# Patient Record
Sex: Female | Born: 1960
Health system: Southern US, Community
[De-identification: ages and names within clinical notes are randomized; demographics above are authoritative.]

## PROBLEM LIST (undated history)

## (undated) DIAGNOSIS — K219 Gastro-esophageal reflux disease without esophagitis: Secondary | ICD-10-CM

## (undated) DIAGNOSIS — D169 Benign neoplasm of bone and articular cartilage, unspecified: Secondary | ICD-10-CM

## (undated) DIAGNOSIS — E785 Hyperlipidemia, unspecified: Secondary | ICD-10-CM

## (undated) DIAGNOSIS — J45909 Unspecified asthma, uncomplicated: Secondary | ICD-10-CM

## (undated) DIAGNOSIS — K859 Acute pancreatitis without necrosis or infection, unspecified: Secondary | ICD-10-CM

## (undated) DIAGNOSIS — IMO0001 Reserved for inherently not codable concepts without codable children: Secondary | ICD-10-CM

## (undated) DIAGNOSIS — R030 Elevated blood-pressure reading, without diagnosis of hypertension: Secondary | ICD-10-CM

## (undated) DIAGNOSIS — E119 Type 2 diabetes mellitus without complications: Principal | ICD-10-CM

## (undated) HISTORY — DX: Reserved for inherently not codable concepts without codable children: IMO0001

## (undated) HISTORY — DX: Benign neoplasm of bone and articular cartilage, unspecified: D16.9

## (undated) HISTORY — DX: Type 2 diabetes mellitus without complications: E11.9

## (undated) HISTORY — PX: OTHER SURGICAL HISTORY: SHX169

## (undated) HISTORY — DX: Hyperlipidemia, unspecified: E78.5

## (undated) HISTORY — DX: Acute pancreatitis without necrosis or infection, unspecified: K85.90

## (undated) HISTORY — DX: Unspecified asthma, uncomplicated: J45.909

## (undated) HISTORY — DX: Gastro-esophageal reflux disease without esophagitis: K21.9

## (undated) HISTORY — DX: Elevated blood-pressure reading, without diagnosis of hypertension: R03.0

---

## 1999-12-22 ENCOUNTER — Other Ambulatory Visit: Admission: RE | Admit: 1999-12-22 | Discharge: 1999-12-22 | Payer: Self-pay | Admitting: Unknown Physician Specialty

## 1999-12-30 ENCOUNTER — Encounter: Admission: RE | Admit: 1999-12-30 | Discharge: 1999-12-30 | Payer: Self-pay | Admitting: Obstetrics and Gynecology

## 1999-12-30 ENCOUNTER — Encounter: Payer: Self-pay | Admitting: *Deleted

## 2000-12-24 ENCOUNTER — Other Ambulatory Visit: Admission: RE | Admit: 2000-12-24 | Discharge: 2000-12-24 | Payer: Self-pay | Admitting: *Deleted

## 2001-01-17 ENCOUNTER — Encounter: Admission: RE | Admit: 2001-01-17 | Discharge: 2001-01-17 | Payer: Self-pay | Admitting: *Deleted

## 2001-01-17 ENCOUNTER — Encounter: Payer: Self-pay | Admitting: *Deleted

## 2002-01-02 ENCOUNTER — Other Ambulatory Visit: Admission: RE | Admit: 2002-01-02 | Discharge: 2002-01-02 | Payer: Self-pay | Admitting: *Deleted

## 2002-04-13 ENCOUNTER — Encounter: Payer: Self-pay | Admitting: *Deleted

## 2002-04-13 ENCOUNTER — Encounter: Admission: RE | Admit: 2002-04-13 | Discharge: 2002-04-13 | Payer: Self-pay | Admitting: *Deleted

## 2002-11-16 ENCOUNTER — Encounter: Payer: Self-pay | Admitting: Gastroenterology

## 2002-11-16 ENCOUNTER — Encounter: Admission: RE | Admit: 2002-11-16 | Discharge: 2002-11-16 | Payer: Self-pay | Admitting: Gastroenterology

## 2002-11-29 ENCOUNTER — Encounter (INDEPENDENT_AMBULATORY_CARE_PROVIDER_SITE_OTHER): Payer: Self-pay | Admitting: Specialist

## 2002-11-29 ENCOUNTER — Ambulatory Visit (HOSPITAL_COMMUNITY): Admission: RE | Admit: 2002-11-29 | Discharge: 2002-11-29 | Payer: Self-pay | Admitting: Gastroenterology

## 2003-01-03 ENCOUNTER — Ambulatory Visit (HOSPITAL_COMMUNITY): Admission: RE | Admit: 2003-01-03 | Discharge: 2003-01-03 | Payer: Self-pay | Admitting: *Deleted

## 2003-01-03 ENCOUNTER — Encounter (INDEPENDENT_AMBULATORY_CARE_PROVIDER_SITE_OTHER): Payer: Self-pay

## 2003-02-02 ENCOUNTER — Other Ambulatory Visit: Admission: RE | Admit: 2003-02-02 | Discharge: 2003-02-02 | Payer: Self-pay | Admitting: *Deleted

## 2003-06-06 ENCOUNTER — Encounter: Admission: RE | Admit: 2003-06-06 | Discharge: 2003-06-06 | Payer: Self-pay | Admitting: *Deleted

## 2004-07-24 ENCOUNTER — Encounter: Admission: RE | Admit: 2004-07-24 | Discharge: 2004-07-24 | Payer: Self-pay | Admitting: *Deleted

## 2005-07-28 ENCOUNTER — Encounter: Admission: RE | Admit: 2005-07-28 | Discharge: 2005-07-28 | Payer: Self-pay | Admitting: *Deleted

## 2006-09-06 ENCOUNTER — Encounter: Admission: RE | Admit: 2006-09-06 | Discharge: 2006-09-06 | Payer: Self-pay | Admitting: Specialist

## 2007-05-26 HISTORY — PX: PARTIAL HYSTERECTOMY: SHX80

## 2007-05-26 LAB — HM COLONOSCOPY

## 2007-08-10 ENCOUNTER — Encounter: Admission: RE | Admit: 2007-08-10 | Discharge: 2007-08-10 | Payer: Self-pay | Admitting: Gastroenterology

## 2007-10-27 ENCOUNTER — Encounter: Admission: RE | Admit: 2007-10-27 | Discharge: 2007-10-27 | Payer: Self-pay | Admitting: Specialist

## 2008-10-29 ENCOUNTER — Encounter: Admission: RE | Admit: 2008-10-29 | Discharge: 2008-10-29 | Payer: Self-pay | Admitting: Specialist

## 2010-03-11 ENCOUNTER — Encounter: Admission: RE | Admit: 2010-03-11 | Discharge: 2010-03-11 | Payer: Self-pay | Admitting: Specialist

## 2010-04-06 ENCOUNTER — Emergency Department (HOSPITAL_BASED_OUTPATIENT_CLINIC_OR_DEPARTMENT_OTHER): Admission: EM | Admit: 2010-04-06 | Discharge: 2010-04-06 | Payer: Self-pay | Admitting: Emergency Medicine

## 2010-04-06 ENCOUNTER — Ambulatory Visit: Payer: Self-pay | Admitting: Diagnostic Radiology

## 2010-10-10 NOTE — Op Note (Signed)
Janice Shaw, Janice Shaw                       ACCOUNT NO.:  [id number]   MEDICAL RECORD NO.:  [id number]                   PATIENT TYPE:  AMB   LOCATION:  ENDO                                 FACILITY:  [facility]   PHYSICIAN:  Anselmo Rod, M.D.               DATE OF BIRTH:  [birth year]   DATE OF PROCEDURE:  11/29/2002  DATE OF DISCHARGE:                                 OPERATIVE REPORT   PROCEDURE PERFORMED:  Screening colonoscopy.   ENDOSCOPIST:  Anselmo Rod, M.D.   INSTRUMENT USED:  Olympus video colonoscope.   INDICATIONS FOR PROCEDURE:  A 50 year old white female with a history of  abnormal weight loss, change in bowel habits and questionable polyp on  digital rectal examination undergoing colonoscopy to rule out masses, etc.   PREPROCEDURE PREPARATION:  Informed consent was procured from the patient.  The patient was fasted for eight hours prior to the procedure and prepped  with a bottle of magnesium citrate and a gallon of NuLytely the night prior  to the procedure.   PREPROCEDURE PHYSICAL:  VITAL SIGNS:  The patient had stable vital signs.  NECK:  Supple.  CHEST:  Clear to auscultation.  S1 and S2 regular.  ABDOMEN:  Soft with normal bowel sounds.   DESCRIPTION OF PROCEDURE:  The patient was placed in the left lateral  decubitus position and sedated with 100 mg of Demerol and 10 mg of Versed  intravenously. Once the patient was adequately sedated and maintained on low  flow oxygen and continuous cardiac monitoring, the Olympus video colonoscope  was advanced into the rectum to the cecum with slight difficulty.  The  patient had a very atonic colon.  Two small sessile polyps were biopsied  from 20 cm.  Small internal hemorrhoids were seen on retroflexion of the  rectum.  The appendicular orifice and ileocecal valve were visualized and  photographed.  The terminal ileum appeared normal.  The patient tolerated  the procedure well without complications.  There seemed  to be some extrinsic  compression of the rectum but no masses or polyps were seen.  Retroflexion  of the rectum revealed internal hemorrhoids but no other abnormalities were  noted.   IMPRESSION:  1. Two small sessile polyps biopsied from 20 cm.  2. Small internal hemorrhoids.  3. Questionable extrinsic of the rectum.    RECOMMENDATIONS:  1. Await pathology results.  2. Possible pelvic ultrasound to evaluate extrinsic compression of the     rectum.  3. Outpatient follow up in the next two weeks for further recommendations.                                               Anselmo Rod, M.D.    JNM/MEDQ  D:  11/29/2002  T:  11/30/2002  Job:  [id number]   cc:   Gerri Spore B. Earlene Plater, M.D.  [address]  [place]  [place] [address]  Fax: [phone]

## 2010-10-10 NOTE — H&P (Signed)
   NAME:  Janice Shaw, Janice Shaw                       ACCOUNT NO.:  [id number]   MEDICAL RECORD NO.:  [id number]                   PATIENT TYPE:  AMB   LOCATION:  SDC                                  [facility]ACILITY:  [facility]   PHYSICIAN:  [place] B. Earlene Plater, M.D.               DATE OF BIRTH:  Oct 18, 1960   DATE OF ADMISSION:  01/03/2003  DATE OF DISCHARGE:                                HISTORY & PHYSICAL   PREOPERATIVE DIAGNOSIS:  Endometrial mass.   INTENDED PROCEDURE:  Hysteroscopy D&C and removal of presumed endometrial  polyp.   HISTORY OF PRESENT ILLNESS:  A 49 year old white female gravida 1 para 1  presents for hysteroscopy and removal of presumed endometrial polyp.  The  patient had a CT scan of the abdomen and pelvis in July with Dr. Loreta Ave for  abdominal and epigastric pain.  Was found to have incidental thickening of  the endometrial lining.  Subsequent pelvic ultrasound in the office shows a  2.4 cm endometrial mass.  Given the obvious thickening which was focal in  nature a sonohysterogram was not performed and I recommended we proceed  directly to hysteroscopy.   PAST MEDICAL HISTORY:  None.   PAST SURGICAL HISTORY:  TAB x1 and granuloma removal.   MEDICATIONS:  Mircette.   ALLERGIES:  1. ASPIRIN.  2. PENICILLIN.  3. ERYTHROMYCIN.  4. CODEINE.   SOCIAL HISTORY:  No alcohol, tobacco, or other drugs.   FAMILY HISTORY:  Heart disease, diabetes, hypertension, and mother had non-  Hodgkin's lymphoma.   REVIEW OF SYSTEMS:  Otherwise negative.   PHYSICAL EXAMINATION:  VITAL SIGNS:  Blood pressure 130/80, temperature 99,  weight 144.  GENERAL:  Alert and oriented, no acute distress.  SKIN:  Warm and dry, no lesions.  HEART:  Regular rate and rhythm.  LUNGS:  Clear to auscultation.  ABDOMEN:  Liver and spleen normal, no hernia; no mass palpable.  PELVIC:  Normal external genitalia, vagina and cervix normal.  Uterus is  normal size, nontender, without adnexal masses or  tenderness.   LABORATORY DATA:  Ultrasound as outlined above shows a greater than 2 cm  focal endometrial mass consistent with a large polyp or submucosal fibroid.   ASSESSMENT:  Endometrial mass, probable polyp.  Recommend surgical removal.   PLAN:  Hysteroscopy D&C and removal of mass.  Operative risks discussed  including infection, bleeding, uterine perforation, damage to surrounding  organs, and fluid overload.  All questions answered; the patient wishes to  proceed.                                               Gerri Spore B. Earlene Plater, M.D.    WBD/MEDQ  D:  01/03/2003  T:  01/03/2003  Job:  [id number]

## 2010-10-10 NOTE — Op Note (Signed)
NAME:  Janice Shaw, Janice Shaw                       ACCOUNT NO.:  [id number]   MEDICAL RECORD NO.:  [id number]                   PATIENT TYPE:  AMB   LOCATION:  SDC                                  [facility]   PHYSICIAN:  [place] B. Earlene Plater, M.D.               DATE OF BIRTH:  1960/07/19   DATE OF PROCEDURE:  01/03/2003  DATE OF DISCHARGE:                                 OPERATIVE REPORT   PREOPERATIVE DIAGNOSIS:  Endometrial mass.   POSTOPERATIVE DIAGNOSIS:  Endometrial mass.   PROCEDURE:  Hysteroscopy, D and C, polyp removal.   SURGEON:  [place] B. Earlene Plater, M.D.   ANESTHESIA:  LMA general.   FINDINGS:  Thin endometrium.  Endometrial polyp and possible posterior  submucosal fibroid.   BLOOD LOSS:  50 cc.   FLUID DEFICIT:  100 cc Sorbitol.   SPECIMENS:  Endometrial polyp and endometrial curettings.   COMPLICATIONS:  None.   INDICATIONS:  Patient with a recently incidentally found endometrial mass  after evaluation for upper abdominal and epigastric pain.  CT scan had  revealed endometrial thickening.  Subsequent pelvic ultrasound in the office  showed an obvious endometrial mass.  The patient presents for hysteroscopic  diagnosis and potential treatment.   PROCEDURE:  The patient was taken to the operating room and LMA general  anesthesia was obtained.  He was placed in the sitting position and prepped  and draped in a standard fashion.  The bladder was emptied with a red rubber  catheter.  Exam under anesthesia showed a normal-sized anteverted uterus, no  adnexal masses palpable.   Speculum inserted.  Single-tooth tenaculum attached and the cervix easily  dilated to a #21.  The diagnostic hysteroscope was inserted after being  flushed with Sorbitol.  With uterine distension, an endometrial polyp was  visualized.  This was removed with the [place] forceps.  The  hysteroscope was reinserted and appeared to be a submucosal fibroid slightly  deforming the posterior  uterine cavity.  However, slight bleeding was  limiting visualization as it was difficult to keep the field clear and have  good uterine distension at the same time.  Therefore I dilated the cervix up  to accommodate the resectoscope and tried once again to visualize the area  more completely.  It did appear that there was a small submucosal fibroid  but I did not have adequate continuous visualization due to slight bleeding  to safely resect this area.  Therefore it was left as is.  There were no  other focal lesions noted, and therefore the procedure was terminated.   The patient tolerated the procedure well.  There were no complications.  She  was taken to the recovery room awake, alert, and in stable condition.  Gerri Spore B. Earlene Plater, M.D.    WBD/MEDQ  D:  01/03/2003  T:  01/03/2003  Job:  [id number]

## 2011-02-25 ENCOUNTER — Other Ambulatory Visit: Payer: Self-pay | Admitting: Specialist

## 2011-02-25 DIAGNOSIS — Z1231 Encounter for screening mammogram for malignant neoplasm of breast: Secondary | ICD-10-CM

## 2011-03-13 ENCOUNTER — Ambulatory Visit: Payer: Self-pay

## 2011-03-23 ENCOUNTER — Ambulatory Visit
Admission: RE | Admit: 2011-03-23 | Discharge: 2011-03-23 | Disposition: A | Discharge status: Home / Self Care | Payer: BC Managed Care – PPO | Source: Ambulatory Visit | Attending: Specialist | Admitting: Specialist

## 2011-03-23 DIAGNOSIS — Z1231 Encounter for screening mammogram for malignant neoplasm of breast: Secondary | ICD-10-CM

## 2011-06-25 ENCOUNTER — Ambulatory Visit (INDEPENDENT_AMBULATORY_CARE_PROVIDER_SITE_OTHER): Payer: BC Managed Care – PPO | Admitting: Emergency Medicine

## 2011-06-25 VITALS — BP 118/76 | HR 64 | Temp 98.8°F | Resp 16 | Ht 64.0 in | Wt 134.4 lb

## 2011-06-25 DIAGNOSIS — R05 Cough: Secondary | ICD-10-CM

## 2011-06-25 DIAGNOSIS — J40 Bronchitis, not specified as acute or chronic: Secondary | ICD-10-CM

## 2011-06-25 DIAGNOSIS — R059 Cough, unspecified: Secondary | ICD-10-CM

## 2011-06-25 DIAGNOSIS — R040 Epistaxis: Secondary | ICD-10-CM

## 2011-06-25 MED ORDER — HYDROCOD POLST-CHLORPHEN POLST 10-8 MG/5ML PO LQCR: 5.0000 mL | Freq: Every evening | ORAL | Status: DC | PRN

## 2011-06-25 MED ORDER — DOXYCYCLINE HYCLATE 100 MG PO TABS: 100.0000 mg | ORAL_TABLET | Freq: Two times a day (BID) | ORAL | Status: AC

## 2011-06-25 MED ORDER — BENZONATATE 200 MG PO CAPS: 200.0000 mg | ORAL_CAPSULE | Freq: Two times a day (BID) | ORAL | Status: AC | PRN

## 2011-06-25 NOTE — Progress Notes (Signed)
  Subjective:    Patient ID: Janice Shaw, female    DOB: 1961-05-23, 51 y.o.   MRN: [id number]  HPI Patient comes in complaining of flue like symptoms, congestion, cough, and nose bleeds.  She also complains of bad tasting phlegm in the back of her mouth.  She claims that her cough is worse at night, and coughs up phlegm in the morning.      Review of Systems  Constitutional: Negative.   HENT: Positive for nosebleeds.   Eyes: Negative.   Respiratory: Positive for cough and shortness of breath.   Cardiovascular: Negative for chest pain, palpitations and leg swelling.       Objective:   Physical Exam  Constitutional: She appears well-developed.  HENT:  Head: Normocephalic.  Eyes: Pupils are equal, round, and reactive to light.  Neck: No JVD present. No tracheal deviation present. No thyromegaly present.  Cardiovascular: Normal rate, regular rhythm and normal heart sounds.   Pulmonary/Chest: No respiratory distress. She has no wheezes. She has no rales. She exhibits no tenderness.  Abdominal: She exhibits no distension.  Lymphadenopathy:    She has no cervical adenopathy.          Assessment & Plan:  Recent symptoms of brochitis

## 2011-06-25 NOTE — Patient Instructions (Signed)

## 2012-03-30 ENCOUNTER — Other Ambulatory Visit: Payer: Self-pay | Admitting: Specialist

## 2012-03-30 DIAGNOSIS — Z1231 Encounter for screening mammogram for malignant neoplasm of breast: Secondary | ICD-10-CM

## 2012-04-26 ENCOUNTER — Encounter (INDEPENDENT_AMBULATORY_CARE_PROVIDER_SITE_OTHER): Payer: Self-pay | Admitting: Surgery

## 2012-04-26 ENCOUNTER — Ambulatory Visit (INDEPENDENT_AMBULATORY_CARE_PROVIDER_SITE_OTHER): Payer: BC Managed Care – PPO | Admitting: General Surgery

## 2012-04-26 ENCOUNTER — Encounter (INDEPENDENT_AMBULATORY_CARE_PROVIDER_SITE_OTHER): Payer: Self-pay | Admitting: General Surgery

## 2012-04-26 VITALS — BP 138/80 | HR 60 | Temp 97.8°F | Resp 20 | Ht 64.0 in | Wt 147.6 lb

## 2012-04-26 DIAGNOSIS — K645 Perianal venous thrombosis: Secondary | ICD-10-CM

## 2012-04-26 MED ORDER — TRAMADOL HCL 50 MG PO TABS: 50.0000 mg | ORAL_TABLET | Freq: Four times a day (QID) | ORAL | Status: DC | PRN

## 2012-04-26 MED ORDER — HYDROCORTISONE ACE-PRAMOXINE 2.5-1 % RE CREA: TOPICAL_CREAM | Freq: Four times a day (QID) | RECTAL | Status: DC

## 2012-04-26 NOTE — Progress Notes (Signed)
Patient ID: Janice Shaw, female   DOB: [birth year], 51 y.o.   MRN: [id number]  No chief complaint on file.   HPI Janice Shaw is a 51 y.o. female.   HPI  She is referred by Dr. Loreta Ave for treatment of a thrombosed external hemorrhoid. She was standing a lot last week and Friday had acute pain and swelling in the anal region. She's tried soaking in a tub but has no relief. She saw Dr. Loreta Ave this morning and a thrombosed external hemorrhoid was diagnosed. We were asked to see her because of that. She intermittently does have some constipation.  No past medical history on file.  Past Surgical History  Procedure Date  . Removal of vin     Family History  Problem Relation Age of Onset  . Cancer Mother     non Hodgkins lymphoma  . Heart disease Mother     CHF    Social History History  Substance Use Topics  . Smoking status: Former Smoker    Types: Cigarettes    Quit date: 04/23/2005  . Smokeless tobacco: Not on file  . Alcohol Use: Not on file    Allergies  Allergen Reactions  . Codeine Hives and Itching  . Aspirin     unknown reaction, childhood allergy.  . Erythromycin Rash  . Penicillins Other (See Comments)    childhood allergy    Current Outpatient Prescriptions  Medication Sig Dispense Refill  . dexlansoprazole (DEXILANT) 60 MG capsule Take 60 mg by mouth daily.      . hydrochlorothiazide (MICROZIDE) 12.5 MG capsule Take by mouth daily.      . Magnesium 500 MG CAPS Take by mouth.      . chlorpheniramine-HYDROcodone (TUSSIONEX PENNKINETIC ER) 10-8 MG/5ML LQCR Take 5 mLs by mouth at bedtime as needed.  60 mL  0  . hydrocortisone-pramoxine (ANALPRAM-HC) 2.5-1 % rectal cream Place rectally 4 (four) times daily. For irritated and painful hemorrhoids  15 g  2    Review of Systems Review of Systems  Gastrointestinal: Positive for anal bleeding and rectal pain. Negative for abdominal pain.    Blood pressure 138/80, pulse 60, temperature 97.8 F (36.6 C),  temperature source Temporal, resp. rate 20, height 5\' 4"  (1.626 m), weight 147 lb 9.6 oz (66.951 kg).  Physical Exam Physical Exam  Constitutional:       Uncomfortable appearing female.  Genitourinary:       Swollen right posterior external hemorrhoid with bluish discoloration centrally. Tender to the touch.    Data Reviewed Notes from Dr. Loreta Ave  Assessment    Thrombosed external hemorrhoid in the right posterior position. She has a fair amount of swelling from this.    Plan    Incision and drainage of thrombosed external hemorrhoid. Warm water soaks. Analpram 2.5% ointment to the area. Avoid constipation. Return visit one month.  The perianal area was sterilely prepped. The area around the hemorrhoid was anesthetized with Xylocaine with epinephrine. A cruciate incision was made over the bluish discoloration and clot was evacuated. A dry dressing was applied. She tolerated the procedure well.       Janice Shaw 04/26/2012, 1:48 PM

## 2012-04-26 NOTE — Patient Instructions (Signed)
Warm water tub soaks as much as possible. Avoid constipation. Take a stool softener twice a day. Use milk of magnesia as needed.

## 2012-04-27 ENCOUNTER — Ambulatory Visit: Payer: BC Managed Care – PPO

## 2012-04-28 ENCOUNTER — Telehealth (INDEPENDENT_AMBULATORY_CARE_PROVIDER_SITE_OTHER): Payer: Self-pay

## 2012-04-28 ENCOUNTER — Telehealth (INDEPENDENT_AMBULATORY_CARE_PROVIDER_SITE_OTHER): Payer: Self-pay | Admitting: General Surgery

## 2012-04-28 NOTE — Telephone Encounter (Signed)
Pt had thrombosed hem and was seen it urgent office earlier this week.  She did well the day after, but is still quite swollen now and she is trying to have a BM.  Recommended she continue the warm bath soaks or sitz baths.  Work with Dr. Loreta Ave to achieve a bowel regimen that will produce a soft BM every day.  She understands.

## 2012-04-28 NOTE — Telephone Encounter (Signed)
Pt called again still having a lot of pain and discomfort.  She was in the bathtub.  I told her to start taking Ibuprofen on a 4 hour schedule, drink plenty of water and take a stool softener as well as sitz baths at least six times a day. The swelling is her main problem, and if she followed these instructions she should start feeling better in a day or two.  She will call tomorrow if still no relief.

## 2012-04-28 NOTE — Telephone Encounter (Signed)
The pt called to discuss what she is going through since her visit.  She spoke to [place] this am but wanted to talk to [place] now

## 2012-05-02 ENCOUNTER — Encounter (INDEPENDENT_AMBULATORY_CARE_PROVIDER_SITE_OTHER): Payer: Self-pay

## 2012-05-02 ENCOUNTER — Telehealth (INDEPENDENT_AMBULATORY_CARE_PROVIDER_SITE_OTHER): Payer: Self-pay

## 2012-05-02 NOTE — Telephone Encounter (Signed)
The pt has a part time job that usually requires her to be on her feet all day.  She will need a note.  Please call to discuss.

## 2012-05-06 ENCOUNTER — Ambulatory Visit
Admission: RE | Admit: 2012-05-06 | Discharge: 2012-05-06 | Disposition: A | Discharge status: Home / Self Care | Payer: BC Managed Care – PPO | Source: Ambulatory Visit | Attending: Specialist | Admitting: Specialist

## 2012-05-06 DIAGNOSIS — Z1231 Encounter for screening mammogram for malignant neoplasm of breast: Secondary | ICD-10-CM

## 2012-06-01 ENCOUNTER — Ambulatory Visit (INDEPENDENT_AMBULATORY_CARE_PROVIDER_SITE_OTHER): Payer: BC Managed Care – PPO | Admitting: General Surgery

## 2012-06-01 ENCOUNTER — Encounter (INDEPENDENT_AMBULATORY_CARE_PROVIDER_SITE_OTHER): Payer: Self-pay | Admitting: General Surgery

## 2012-06-01 VITALS — BP 122/74 | HR 76 | Temp 97.6°F | Resp 18 | Ht 64.0 in | Wt 147.0 lb

## 2012-06-01 DIAGNOSIS — K645 Perianal venous thrombosis: Secondary | ICD-10-CM

## 2012-06-01 NOTE — Progress Notes (Signed)
Procedure:  Excision of a external thrombosed hemorrhoid  Date:  04/26/12  Pathology:  na  History:  She is here for her first post-procedure visit and is doing much better.    Exam: General- Is in NAD. Anorectal-wound has healed; multiple small external hemorrhoids are present.  Assessment:  Status post excision of external thrombosed hemorrhoid-she is doing much better.  Plan:  I advised her to avoid constipation and prolonged standing. Return visit as needed.

## 2012-06-01 NOTE — Patient Instructions (Signed)
Avoid constipation. Avoid prolonged standing.

## 2012-07-09 ENCOUNTER — Other Ambulatory Visit: Payer: Self-pay

## 2012-08-10 ENCOUNTER — Ambulatory Visit: Payer: BC Managed Care – PPO | Admitting: Family Medicine

## 2013-01-05 ENCOUNTER — Other Ambulatory Visit: Payer: Self-pay | Admitting: Specialist

## 2013-01-05 DIAGNOSIS — N6321 Unspecified lump in the left breast, upper outer quadrant: Secondary | ICD-10-CM

## 2013-01-24 ENCOUNTER — Ambulatory Visit
Admission: RE | Admit: 2013-01-24 | Discharge: 2013-01-24 | Disposition: A | Discharge status: Home / Self Care | Payer: BC Managed Care – PPO | Source: Ambulatory Visit | Attending: Specialist | Admitting: Specialist

## 2013-01-24 DIAGNOSIS — N6321 Unspecified lump in the left breast, upper outer quadrant: Secondary | ICD-10-CM

## 2013-01-30 ENCOUNTER — Encounter: Payer: Self-pay | Admitting: Internal Medicine

## 2013-01-30 ENCOUNTER — Ambulatory Visit (INDEPENDENT_AMBULATORY_CARE_PROVIDER_SITE_OTHER): Payer: BC Managed Care – PPO | Admitting: Internal Medicine

## 2013-01-30 ENCOUNTER — Ambulatory Visit (HOSPITAL_BASED_OUTPATIENT_CLINIC_OR_DEPARTMENT_OTHER)
Admission: RE | Admit: 2013-01-30 | Discharge: 2013-01-30 | Disposition: A | Discharge status: Home / Self Care | Payer: BC Managed Care – PPO | Source: Ambulatory Visit | Attending: Internal Medicine | Admitting: Internal Medicine

## 2013-01-30 VITALS — BP 146/85 | HR 88 | Temp 98.8°F | Wt 154.2 lb

## 2013-01-30 DIAGNOSIS — R059 Cough, unspecified: Secondary | ICD-10-CM

## 2013-01-30 DIAGNOSIS — R911 Solitary pulmonary nodule: Secondary | ICD-10-CM | POA: Insufficient documentation

## 2013-01-30 DIAGNOSIS — J45909 Unspecified asthma, uncomplicated: Secondary | ICD-10-CM

## 2013-01-30 DIAGNOSIS — R05 Cough: Secondary | ICD-10-CM | POA: Insufficient documentation

## 2013-01-30 DIAGNOSIS — J45991 Cough variant asthma: Secondary | ICD-10-CM | POA: Insufficient documentation

## 2013-01-30 MED ORDER — DOXYCYCLINE HYCLATE 100 MG PO TABS: 100.0000 mg | ORAL_TABLET | Freq: Two times a day (BID) | ORAL | Status: DC

## 2013-01-30 MED ORDER — BUDESONIDE-FORMOTEROL FUMARATE 160-4.5 MCG/ACT IN AERO: 2.0000 | INHALATION_SPRAY | Freq: Two times a day (BID) | RESPIRATORY_TRACT | Status: DC

## 2013-01-30 MED ORDER — HYDROCOD POLST-CHLORPHEN POLST 10-8 MG/5ML PO LQCR: 5.0000 mL | Freq: Every evening | ORAL | Status: DC | PRN

## 2013-01-30 NOTE — Patient Instructions (Signed)
Get the XR at [facility], corner of [address] (10 minutes form here); they are open 24/7 [address]  [place], [place] [address] [phone] ----  For cough, take Mucinex DM twice a day as needed  If the cough is severe, take tussionex at night symbicort 2 puffs twice a day every day Albuterol as needed for cough-wheezing  Take the antibiotic as prescribed  (doxycycline) Call if no better in few days Call anytime if the symptoms are severe

## 2013-01-30 NOTE — Addendum Note (Signed)
Addended by: Wanda Plump on: 01/30/2013 06:42 PM   Modules accepted: Level of Service

## 2013-01-30 NOTE — Progress Notes (Signed)
  Subjective:    Patient ID: Janice Shaw, female    DOB: [birth year], 52 y.o.   MRN: [id number]  HPI New patient, here with her husband. Acute visit ---> cough. Sudden onset of cough about 4 weeks ago, cough could be dry or produce some mucus, could be  persistent day or night, saw her gynecologist and GI : both said  she was wheezing. Eventually at the recommendation of her GI  went to a walk-in clinic 2 weeks ago, a chest x-ray was negative, was prescribed albuterol, Tessalon Perles and Moxifloxacin. She continue with cough, Tessalon is not helping. On further questioning, she recalls there is times when she has wheezing when she has a cold/bronchitis  Past Medical History  Diagnosis Date  . GERD (gastroesophageal reflux disease)   . Elevated BP     took HCTZ   Past Surgical History  Procedure Laterality Date  . Removal of vin    . Partial hysterectomy  2009  . Thrombosed hemorroid     History   Social History  . Marital Status: Single    Spouse Name: N/A    Number of Children: 0  . 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 Smoking status: Former Smoker    Types: Cigarettes    Quit date: 04/23/2005  . Smokeless tobacco: Never Used  . Alcohol Use: Yes     Comment: socially   . Drug Use: No  . Sexual Activity: Not on file   Other Topics Concern  . Not on file   Social History Narrative  . No narrative on file   Family History  Problem Relation Age of Onset  . Cancer Mother     non Hodgkins lymphoma  . Heart disease Mother     CHF  . Colon cancer Neg Hx   . Breast cancer Neg Hx       Review of Systems Denies fever, chills, unexpected weight loss. No chest pain or hemoptysis. Symptoms did not start after a URI this time, never had runny nose or postnasal dripping. No sneezing, itchy eyes or itchy nose. History of GERD, symptoms well controlled with omeprazole.     Objective:   Physical Exam BP  146/85  Pulse 88  Temp(Src) 98.8 F (37.1 C)  Wt 154 lb 3.2 oz (69.945 kg)  BMI 26.46 kg/m2  SpO2 99% General -- alert, well-developed, NAD.   HEENT-- Not pale. TMs normal, throat symmetric, no redness or discharge. Face symmetric, sinuses not tender to palpation. Nose not congested.  Lungs -- normal respiratory effort, no intercostal retractions, no accessory muscle use.Mild large airway congestion mostly with cough. Increased expiratory time. Not clear, wheezing. Heart-- normal rate, regular rhythm, no murmur.  Abdomen-- Not distended, good bowel sounds,soft, non-tender. Extremities-- no pretibial edema bilaterally  Neurologic-- alert & oriented X3. Speech, gait normal. Psych-- Cognition and judgment appear intact. Alert and cooperative with normal attention span and concentration. not anxious appearing and not depressed appearing.     Assessment & Plan:

## 2013-01-30 NOTE — Assessment & Plan Note (Addendum)
52 year old lady former smoker w/ 4  weeks history of cough and wheezing, in the past she did have wheezing w/ URIs. I believe she has reactive airway disease. Plan: Repeat chest x-ray Doxycycline Symbicort twice a day and albuterol when necessary Mucinex as needed Continue Prilosec, GERD symptoms well controlled See instructions reassess on RTC

## 2013-01-31 ENCOUNTER — Other Ambulatory Visit: Payer: Self-pay | Admitting: Internal Medicine

## 2013-01-31 DIAGNOSIS — R911 Solitary pulmonary nodule: Secondary | ICD-10-CM

## 2013-02-01 ENCOUNTER — Ambulatory Visit (HOSPITAL_BASED_OUTPATIENT_CLINIC_OR_DEPARTMENT_OTHER)
Admission: RE | Admit: 2013-02-01 | Discharge: 2013-02-01 | Disposition: A | Discharge status: Home / Self Care | Payer: BC Managed Care – PPO | Source: Ambulatory Visit | Attending: Internal Medicine | Admitting: Internal Medicine

## 2013-02-01 DIAGNOSIS — R911 Solitary pulmonary nodule: Secondary | ICD-10-CM | POA: Insufficient documentation

## 2013-02-14 ENCOUNTER — Encounter: Payer: Self-pay | Admitting: Internal Medicine

## 2013-02-14 ENCOUNTER — Ambulatory Visit (INDEPENDENT_AMBULATORY_CARE_PROVIDER_SITE_OTHER): Payer: BC Managed Care – PPO | Admitting: Internal Medicine

## 2013-02-14 VITALS — BP 137/76 | HR 76 | Temp 98.4°F | Wt 153.0 lb

## 2013-02-14 DIAGNOSIS — R030 Elevated blood-pressure reading, without diagnosis of hypertension: Secondary | ICD-10-CM

## 2013-02-14 DIAGNOSIS — K859 Acute pancreatitis without necrosis or infection, unspecified: Secondary | ICD-10-CM | POA: Insufficient documentation

## 2013-02-14 DIAGNOSIS — IMO0001 Reserved for inherently not codable concepts without codable children: Secondary | ICD-10-CM

## 2013-02-14 DIAGNOSIS — Z23 Encounter for immunization: Secondary | ICD-10-CM

## 2013-02-14 DIAGNOSIS — E119 Type 2 diabetes mellitus without complications: Secondary | ICD-10-CM

## 2013-02-14 DIAGNOSIS — E785 Hyperlipidemia, unspecified: Secondary | ICD-10-CM

## 2013-02-14 DIAGNOSIS — J45909 Unspecified asthma, uncomplicated: Secondary | ICD-10-CM

## 2013-02-14 HISTORY — DX: Hyperlipidemia, unspecified: E78.5

## 2013-02-14 HISTORY — DX: Acute pancreatitis without necrosis or infection, unspecified: K85.90

## 2013-02-14 HISTORY — DX: Type 2 diabetes mellitus without complications: E11.9

## 2013-02-14 NOTE — Progress Notes (Signed)
  Subjective:    Patient ID: Janice Shaw, female    DOB: 08-09-1960, 52 y.o.   MRN: [id number]  HPI Here to discuss several issues: Reactive airway disease--continue with cough, intense at times, occasional wheezing, still produces yellow mucus apparently worse with Mucinex. Essentially not better. Pancreatitis--history of pancreatitis, the patient gave me a detailed description of her symptoms during the last year, see assessment and plan. She brought labs for my review-- she has hyperlipidemia and had A1c of 6.7. See assessment and plan. History of elevated BP--ambulatory BPs usually okay. See assessment and plan.  Past Medical History  Diagnosis Date  . GERD (gastroesophageal reflux disease)   . Elevated BP     took HCTZ  . Diabetes 02/14/2013  . Pancreatitis 02/14/2013    Due to HCTZ ? Saw Dr Loreta Ave    Past Surgical History  Procedure Laterality Date  . Removal of vin    . Partial hysterectomy  2009  . Thrombosed hemorroid     History   Social History  . Marital Status: Married    Spouse Name: N/A    Number of Children: 0  . 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 Smoking status: Former Smoker    Types: Cigarettes    Quit date: 04/23/2005  . Smokeless tobacco: Never Used  . Alcohol Use: Yes     Comment: socially   . Drug Use: No  . Sexual Activity: Not on file   Other Topics Concern  . Not on file   Social History Narrative  . No narrative on file     Review of Systems No nausea, vomiting, diarrhea or blood in the stools. No dyspnea on exertion except for the last week when she felt slightly short of breath exercising. Diet is in general healthy Exercise, she remains active trying to do zumba and  other exercises.     Objective:   Physical Exam BP 137/76  Pulse 76  Temp(Src) 98.4 F (36.9 C)  Wt 153 lb (69.4 kg)  BMI 26.25 kg/m2  SpO2 99% General -- alert, well-developed, NAD.  Lungs --  normal respiratory effort, no intercostal retractions, no accessory muscle use, few ronchi, no wheezing Heart-- normal rate, regular rhythm, no murmur.   Extremities-- no pretibial edema bilaterally   Psych-- Cognition and judgment appear intact. Cooperative with normal attention span and concentration. No anxious appearing , no depressed appearing.       Assessment & Plan:  Today , I spent more than 25 min with the patient, >50% of the time counseling About diet and exercise and understanding that her recent GI history.

## 2013-02-14 NOTE — Assessment & Plan Note (Signed)
Labs from 01/05/2013: Total cholesterol 222, LDL 131, HDL 79. Patient has mild dyslipidemia, ideal LDL around 100 given mild diabetes. Plan: Diet, exercise, recheck on return to the office.

## 2013-02-14 NOTE — Assessment & Plan Note (Signed)
Started taking HCTZ for mildly elevated BP 3 years ago, currently on no meds, ambulatory BP satisfactory. Plan: Continue monitoring BPs, reassess and return to the office

## 2013-02-14 NOTE — Patient Instructions (Signed)
Check the  blood pressure 2 or 3 times a week, be sure it is between 110/60 and 140/85. If it is consistently higher or lower, let me know  

## 2013-02-14 NOTE — Assessment & Plan Note (Signed)
Labs on  01/05/2013 done elsewhere showed an  A1c of 6.7, she has early diabetes. DX was discussed with the patient, at this point I think we need to improve even more her lifestyle. Encourage exercise more Referred to nutritionist Mrs. Spagnola.

## 2013-02-14 NOTE — Assessment & Plan Note (Addendum)
Around 6 to 12 months ago she started to have abdominal pain. Went to Dr. Loreta Ave, CT of the abdomen show an abnormal pancreas, also her lipase was in the 400s. Subsequently the patient had MRI 10/28/2012, report is scanned, no true hepatic lesion seen, mild dilatation of pancreatic duct which could be developmental. The patient was prescribed Creone, symptoms resolve, lipase went back to normal. Patient was told findings could be due to HCTZ which she started to take 3 years ago. She discontinue HCTZ 12-2012 Plan: Recheck an amylase and lipase on return to the office.

## 2013-02-14 NOTE — Assessment & Plan Note (Signed)
Since the last time she was here, she took medications as prescribed, she's not doing better. Chest x-ray clear except for a question of a nodule, followup CT did not confirm the nodule. Plan: Not improving, refer to pulmonary.

## 2013-02-15 ENCOUNTER — Encounter: Payer: Self-pay | Admitting: Internal Medicine

## 2013-02-27 ENCOUNTER — Telehealth: Payer: Self-pay | Admitting: *Deleted

## 2013-02-27 ENCOUNTER — Other Ambulatory Visit: Payer: Self-pay | Admitting: Internal Medicine

## 2013-02-27 MED ORDER — HYDROCOD POLST-CHLORPHEN POLST 10-8 MG/5ML PO LQCR: 5.0000 mL | Freq: Every evening | ORAL | Status: DC | PRN

## 2013-02-27 NOTE — Telephone Encounter (Signed)
Rx printed

## 2013-02-27 NOTE — Telephone Encounter (Signed)
Pt called and stated that she need a refill on her Tussionex. Pt has an appointment on next Wed with the pulmonologist. She would like the medication be refill until her appointment. Please advise.  SW, CMA

## 2013-03-06 ENCOUNTER — Telehealth: Payer: Self-pay | Admitting: *Deleted

## 2013-03-06 ENCOUNTER — Encounter: Payer: Self-pay | Admitting: *Deleted

## 2013-03-06 MED ORDER — HYDROCOD POLST-CHLORPHEN POLST 10-8 MG/5ML PO LQCR: 5.0000 mL | Freq: Every evening | ORAL | Status: DC | PRN

## 2013-03-06 NOTE — Telephone Encounter (Signed)
Advise patient, I will refill her medication. Needs a contract pain he is at her earliest convenience. Also,  she was referred to pulmonary, please be assured that happens.

## 2013-03-06 NOTE — Telephone Encounter (Signed)
chlorpheniramine-HYDROcodone (TUSSIONEX PENNKINETIC ER) 10-8 MG/5ML LQCR Last OV: 02/14/2013 Last refill: 02/27/2013 No UDS on file. Patient needs to sign controlled substance contract

## 2013-03-06 NOTE — Telephone Encounter (Signed)
Called patient to advise her that prescription for Tussionex was ready for pick up at our front desk. She stated that she didn't need the prescription because it was picked up last week at her pharmacy.

## 2013-03-08 ENCOUNTER — Ambulatory Visit (INDEPENDENT_AMBULATORY_CARE_PROVIDER_SITE_OTHER): Payer: BC Managed Care – PPO | Admitting: Internal Medicine

## 2013-03-08 ENCOUNTER — Encounter: Payer: Self-pay | Admitting: Internal Medicine

## 2013-03-08 VITALS — BP 140/88 | HR 73 | Temp 97.9°F | Ht 64.0 in | Wt 152.4 lb

## 2013-03-08 DIAGNOSIS — R05 Cough: Secondary | ICD-10-CM

## 2013-03-08 DIAGNOSIS — R059 Cough, unspecified: Secondary | ICD-10-CM

## 2013-03-08 DIAGNOSIS — R053 Chronic cough: Secondary | ICD-10-CM

## 2013-03-08 NOTE — Progress Notes (Signed)
Subjective:    Patient ID: Janice Shaw, female    DOB: 1960/08/16, 52 y.o.   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 Previously well. Works out actively in the [employer]. Was well up until end of July. She made a routine social trip to [place] to visit her family and upon return noticed insidious onset of cough associated with wheeze and some yellow sputum. It was mild initially. She noted. Sometime in September she had OB visit and Dr. noticed some wheezing. Several weeks later routine clinical exam by gastroenterologist and wheezing was again noted. She then reported to an urgent care where she was treated with antibiotics but this did not help cough. Overall cough severity is unchanged since onset. She describes cough as moderate in severity with occasional severe episodes. Cough is present a day and night. Cough is associated with white and yellow mucus. Cough is improved with Tussionex but after 12 hours cough returns. Cough is also made worse with heavy is zumba class. Primary care physician tried Symbicort but she is only taking this irregularly so she's not sure if this is helpful.  - There is associated feeling of mucus stuck in the chest but no associated gag or ticklishness in the throat or postnasal drainage  RSI cough score 25 and reflects LPR cough. Cough differentiator 1.5 for GERD, 2 for neurogenic/airway  Cough relevant history  - Exposures: Previous smoker. Quit 7 years ago. 16.5 pack smoking history  - Allergies: She denies any spring allergies and nasal drainage  - GI: She has chronic acid reflux disease that is essentially controlled with proton pump inhibitor. However, if she stops them she will get symptoms within a few days.  - Pulmonary history: No known diagnosis of asthma or COPD. CT scan of the chest September 2014 is normal Spirometyr today is normal.  Her brother has asthma (last symbicort 3d ago)  - Hypertension: Not on any  medication   Dr Gretta Cool Reflux Symptom Index (> 13-15 suggestive of LPR cough) 0 -> 5  =  none ->severe problem  Hoarseness of problem with voice 3  Clearing  Of Throat 4  Excess throat mucus or feeling of post nasal drip 4  Difficulty swallowing food, liquid or tablets 0  Cough after eating or lying down 4  Breathing difficulties or choking episodes 1  Troublesome or annoying cough 5  Sensation of something sticking in throat or lump in throat 4  Heartburn, chest pain, indigestion, or stomach acid coming up 0  TOTAL 25     Kouffman Reflux v Neurogenic Cough Differentiator Reflux  Do you awaken from a sound sleep coughing violently?                            With trouble breathing? sometimes  Do you have choking episodes when you cannot  Get enough air, gasping for air ?              no  Do you usually cough when you lie down into  The bed, or when you just lie down to rest ?                          Yes  Do you usually cough after meals or eating?         n  Do you cough when (or after) you  bend over?    n  GERD SCORE  1.5  Kouffman Reflux v Neurogenic Cough Differentiator Neurogenic  Do you more-or-less cough all day long? y  Does change of temperature make you cough? n  Does laughing or chuckling cause you to cough? y  Do fumes (perfume, automobile fumes, burned  Toast, etc.,) cause you to cough ?      n  Does speaking, singing, or talking on the phone cause you to cough   ?               n  Neurogenic/Airway score 2      Social   reports that she quit smoking about 7 years ago. Her smoking use included Cigarettes. She has a 16.5 pack-year smoking history. She has never used smokeless tobacco.   Past Medical History  Diagnosis Date  . GERD (gastroesophageal reflux disease)   . Elevated BP     took HCTZ  . Diabetes 02/14/2013  . Pancreatitis 02/14/2013    Due to HCTZ ? Saw Dr Loreta Ave      Family History  Problem Relation Age of Onset  . Cancer Mother     non  Hodgkins lymphoma  . Heart disease Mother     CHF  . Colon cancer Neg Hx   . Breast cancer Neg Hx      History   Social History  . Marital Status: Married    Spouse Name: N/A    Number of Children: 0  . 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 Smoking status: Former Smoker -- 0.50 packs/day for 33 years    Types: Cigarettes    Quit date: 04/23/2005  . Smokeless tobacco: Never Used  . Alcohol Use: Yes     Comment: socially   . Drug Use: No  . Sexual Activity: Not on file   Other Topics Concern  . Not on file   Social History Narrative  . No narrative on file     Allergies  Allergen Reactions  . Codeine Hives and Itching  . Aspirin     unknown reaction, childhood allergy.  . Hctz [Hydrochlorothiazide]     ?pancreatitis  . Erythromycin Rash  . Penicillins Other (See Comments)    childhood allergy     Outpatient Prescriptions Prior to Visit  Medication Sig Dispense Refill  . albuterol (PROAIR HFA) 108 (90 BASE) MCG/ACT inhaler Inhale 2 puffs into the lungs every 6 (six) hours as needed for wheezing.      . budesonide-formoterol (SYMBICORT) 160-4.5 MCG/ACT inhaler Inhale 2 puffs into the lungs 2 (two) times daily.  1 Inhaler  3  . chlorpheniramine-HYDROcodone (TUSSIONEX PENNKINETIC ER) 10-8 MG/5ML LQCR Take 5 mLs by mouth at bedtime as needed.  115 mL  0  . Magnesium 500 MG CAPS Take by mouth.      Marland Kitchen omeprazole (PRILOSEC) 20 MG capsule Take 20 mg by mouth daily.      Marland Kitchen zolpidem (AMBIEN) 5 MG tablet Take 5 mg by mouth as needed for sleep.      . chlorpheniramine-HYDROcodone (TUSSIONEX PENNKINETIC ER) 10-8 MG/5ML LQCR Take 5 mLs by mouth at bedtime as needed.  115 mL  0   No facility-administered medications prior to visit.      Review of Systems  Constitutional: Negative for fever and unexpected weight change.  HENT: Negative for congestion, dental problem, ear pain, nosebleeds, postnasal drip, rhinorrhea,  sinus pressure, sneezing, sore throat and trouble swallowing.   Eyes: Negative for redness and itching.  Respiratory: Positive for cough and shortness of breath. Negative for chest tightness and wheezing.   Cardiovascular: Negative for palpitations and leg swelling.  Gastrointestinal: Negative for nausea and vomiting.  Genitourinary: Negative for dysuria.  Musculoskeletal: Negative for joint swelling.  Skin: Negative for rash.  Neurological: Negative for headaches.  Hematological: Does not bruise/bleed easily.  Psychiatric/Behavioral: Negative for dysphoric mood. The patient is not nervous/anxious.        Objective:   Physical Exam  Vitals reviewed. Constitutional: She is oriented to person, place, and time. She appears well-developed and well-nourished. No distress.  HENT:  Head: Normocephalic and atraumatic.  Right Ear: External ear normal.  Left Ear: External ear normal.  Mouth/Throat: Oropharynx is clear and moist. No oropharyngeal exudate.  Mild post anasal drainag +  Eyes: Conjunctivae and EOM are normal. Pupils are equal, round, and reactive to light. Right eye exhibits no discharge. Left eye exhibits no discharge. No scleral icterus.  Neck: Normal range of motion. Neck supple. No JVD present. No tracheal deviation present. No thyromegaly present.  Cardiovascular: Normal rate, regular rhythm, normal heart sounds and intact distal pulses.  Exam reveals no gallop and no friction rub.   No murmur heard. Pulmonary/Chest: Effort normal and breath sounds normal. No respiratory distress. She has no wheezes. She has no rales. She exhibits no tenderness.  Scattered wheeze +  Abdominal: Soft. Bowel sounds are normal. She exhibits no distension and no mass. There is no tenderness. There is no rebound and no guarding.  Musculoskeletal: Normal range of motion. She exhibits no edema and no tenderness.  Lymphadenopathy:    She has no cervical adenopathy.  Neurological: She is alert and  oriented to person, place, and time. She has normal reflexes. No cranial nerve deficit. She exhibits normal muscle tone. Coordination normal.  Skin: Skin is warm and dry. No rash noted. She is not diaphoretic. No erythema. No pallor.  Psychiatric: She has a normal mood and affect. Her behavior is normal. Judgment and thought content normal.   Filed Vitals:   03/08/13 1422  BP: 140/88  Pulse: 73  Temp: 97.9 F (36.6 C)  Height: 5\' 4"  (1.626 m)  Weight: 69.128 kg (152 lb 6.4 oz)  SpO2: 91%   Body mass index is 26.15 kg/(m^2).        Assessment & Plan:

## 2013-03-08 NOTE — Patient Instructions (Addendum)
Cough is unexplained but likely combination of sinus drainge, acid reflux and undiagnosed obstructive airways disease all working togethter to cause cough. The combination can result in irritable larynx syndrome  For sinus  -  take generic fluticasone inhaler 2 squirts each nostril daily  - take 2.7% nasal saline (hypertonic nasal saline) spray 2 squirts each nigh For acid reflux  - continue prilosec For airway evaluation  - have methacholine challenge (need to be off symbicort for 12 days)  - please call us as soon as test is complete  Followup  - cal after methacholine challenge test  -  6weeks

## 2013-03-09 DIAGNOSIS — R053 Chronic cough: Secondary | ICD-10-CM | POA: Insufficient documentation

## 2013-03-09 DIAGNOSIS — R05 Cough: Secondary | ICD-10-CM | POA: Insufficient documentation

## 2013-03-09 NOTE — Assessment & Plan Note (Signed)
Cough is unexplained but likely combination of sinus drainge, acid reflux and undiagnosed obstructive airways disease all working togethter to cause cough. The combination can result in irritable larynx syndrome  For sinus  -  take generic fluticasone inhaler 2 squirts each nostril daily  - take 2.7% nasal saline (hypertonic nasal saline) spray 2 squirts each nigh For acid reflux  - continue prilosec For airway evaluation  - have methacholine challenge (need to be off symbicort for 12 days)  - please call us as soon as test is complete  Followup  - cal after methacholine challenge test  -  6weeks 

## 2013-03-15 ENCOUNTER — Telehealth: Payer: Self-pay | Admitting: Internal Medicine

## 2013-03-15 NOTE — Telephone Encounter (Signed)
Chl Mychart After Visit Questionnaire    Question 03/14/2013 11:53 AM   How are you feeling after your recent visit? the same, although I have not exercised, but feel a bit short of breath after light exertion (ie climbing steps); cough seems to tighten at night without any medication/drugs, but mucous returns the next day.   Does the recommended course of treatment seem to be helping your symptoms? No   Are you experiencing any side effects from your recommended treatment? No   is there anything else you would like to ask your physician? Why I still hear the "death rattle" in my chest when I breath. It's very unsettling.   Becomes tight cough at night, then returns to productive cough the next day.  Normally active person, now with minimal exertion (even talking on the phone) she has to "breathe deeper" and OOB with climbing stairs.  Feels lazy now and does not want to attend Zumba class b/c she does not want to start the coughing and SOB again.  Pt is frustrated and concerned about her symptoms.  Wonders about COPD and chronic bronchitis.  Patient has Meth Ch Test on 10.27.14 Dr Marchelle Gearing please advise, thank you.  **patient aware MR on 11pm elink tonight and okay with call back tomorrow **pt requested copy of her 10.15.14 spirometry >> placed in mail to verified home address

## 2013-03-16 NOTE — Telephone Encounter (Signed)
If she has been off any inhaled steroid for atleatst 5-7 days see if you can have her do methacholine challenge test today/tomorrow and see Dr wert immediately after that. Please let her know he is our local cough guru. IF not, just have her come in and see Dr Sherene Sires. I am puzzled myself and without the methacholine challenge test hard pressed to explain. So, I might need the bnefit of a second opinion   Dr. Kalman Shan, M.D., [facility].C.P Pulmonary and Critical Care Medicine Staff Physician  System [facility] Pager: [phone], If no answer or between  15:00h - 7:00h: call [phone]  03/16/2013 7:20 AM

## 2013-03-16 NOTE — Telephone Encounter (Signed)
Pt is already scheduled for [facility] on Monday. She scheduled an appt to see MW in the afternoon. Nothing further needed

## 2013-03-20 ENCOUNTER — Encounter: Payer: Self-pay | Admitting: Internal Medicine

## 2013-03-20 ENCOUNTER — Ambulatory Visit (INDEPENDENT_AMBULATORY_CARE_PROVIDER_SITE_OTHER): Payer: BC Managed Care – PPO | Admitting: Internal Medicine

## 2013-03-20 ENCOUNTER — Ambulatory Visit (HOSPITAL_COMMUNITY)
Admission: RE | Admit: 2013-03-20 | Discharge: 2013-03-20 | Disposition: A | Discharge status: Home / Self Care | Payer: BC Managed Care – PPO | Source: Ambulatory Visit | Attending: Internal Medicine | Admitting: Internal Medicine

## 2013-03-20 VITALS — BP 126/80 | HR 56 | Temp 98.0°F | Ht 63.5 in | Wt 158.4 lb

## 2013-03-20 DIAGNOSIS — J45909 Unspecified asthma, uncomplicated: Secondary | ICD-10-CM

## 2013-03-20 DIAGNOSIS — R05 Cough: Secondary | ICD-10-CM

## 2013-03-20 DIAGNOSIS — R053 Chronic cough: Secondary | ICD-10-CM

## 2013-03-20 DIAGNOSIS — R059 Cough, unspecified: Secondary | ICD-10-CM | POA: Insufficient documentation

## 2013-03-20 MED ORDER — PREDNISONE (PAK) 10 MG PO TABS: ORAL_TABLET | ORAL | Status: DC

## 2013-03-20 MED ORDER — SODIUM CHLORIDE 0.9 % IN NEBU
3.0000 mL | INHALATION_SOLUTION | Freq: Once | RESPIRATORY_TRACT | Status: AC
  Administered 2013-03-20: 3 mL via RESPIRATORY_TRACT

## 2013-03-20 MED ORDER — BUDESONIDE-FORMOTEROL FUMARATE 80-4.5 MCG/ACT IN AERO: 2.0000 | INHALATION_SPRAY | Freq: Two times a day (BID) | RESPIRATORY_TRACT | Status: DC

## 2013-03-20 MED ORDER — METHACHOLINE 0.0625 MG/ML NEB SOLN
2.0000 mL | Freq: Once | RESPIRATORY_TRACT | Status: AC
  Administered 2013-03-20: 0.125 mg via RESPIRATORY_TRACT

## 2013-03-20 MED ORDER — ALBUTEROL SULFATE (5 MG/ML) 0.5% IN NEBU
2.5000 mg | INHALATION_SOLUTION | Freq: Once | RESPIRATORY_TRACT | Status: AC
  Administered 2013-03-20: 2.5 mg via RESPIRATORY_TRACT

## 2013-03-20 MED ORDER — METHACHOLINE 4 MG/ML NEB SOLN
2.0000 mL | Freq: Once | RESPIRATORY_TRACT | Status: AC
  Administered 2013-03-20: 8 mg via RESPIRATORY_TRACT

## 2013-03-20 MED ORDER — METHACHOLINE 1 MG/ML NEB SOLN
2.0000 mL | Freq: Once | RESPIRATORY_TRACT | Status: AC
  Administered 2013-03-20: 2 mg via RESPIRATORY_TRACT

## 2013-03-20 MED ORDER — METHACHOLINE 0.25 MG/ML NEB SOLN
2.0000 mL | Freq: Once | RESPIRATORY_TRACT | Status: AC
  Administered 2013-03-20: 0.5 mg via RESPIRATORY_TRACT

## 2013-03-20 MED ORDER — METHACHOLINE 16 MG/ML NEB SOLN: 2.0000 mL | Freq: Once | RESPIRATORY_TRACT | Status: DC

## 2013-03-20 NOTE — Progress Notes (Signed)
  Subjective:    Patient ID: Janice Shaw, female    DOB: [birth year] MRN: [id number]  HPI  52 yowf lots of bad coughs as child but doesn't remember missiing then smoked with tendency to bronchitis but got better p 2006 then relapsed July 2014 and present daily since   03/20/2013  Consultation/ 2nd opinion  Janice Shaw cc cough daily not waking up with it but within an hour of getting up notes it.  Also chest  Becomes tight cough at night, then returns to productive cough the next day.  Normally active person, now with minimal exertion (even talking on the phone) she has to "breathe deeper" and OOB with climbing stairs.    had mct 03/20/13  which repreoduced most of the symptoms then improved back to chronic baseline p saba rx.  Mucus is always a bit yellow. symbicort didn't really help - the only thing that does is tussionex  No obvious day to day or daytime variabilty or assoc chronic cough or cp or chest tightness, subjective wheeze overt sinus or hb symptoms. No unusual exp hx or h/o childhood pna/ asthma or knowledge of premature birth.  Sleeping ok without nocturnal  or early am exacerbation  of respiratory  c/o's or need for noct saba. Also denies any obvious fluctuation of symptoms with weather or environmental changes or other aggravating or alleviating factors except as outlined above   Current Medications, Allergies, Complete Past Medical History, Past Surgical History, Family History, and Social History were reviewed in [employer] record.           Review of Systems  Constitutional: Negative for fever, chills and unexpected weight change.  HENT: Negative for congestion, dental problem, ear pain, nosebleeds, postnasal drip, rhinorrhea, sinus pressure, sneezing, sore throat, trouble swallowing and voice change.   Eyes: Negative for visual disturbance.  Respiratory: Positive for cough and shortness of breath. Negative for choking.   Cardiovascular: Negative for  chest pain and leg swelling.  Gastrointestinal: Negative for vomiting, abdominal pain and diarrhea.  Genitourinary: Negative for difficulty urinating.  Musculoskeletal: Negative for arthralgias.  Skin: Negative for rash.  Neurological: Negative for tremors, syncope and headaches.  Hematological: Does not bruise/bleed easily.       Objective:   Physical Exam   Pleasant healthy appearing amb wf nad  Wt Readings from Last 3 Encounters:  03/20/13 158 lb 6.4 oz (71.85 kg)  03/08/13 152 lb 6.4 oz (69.128 kg)  02/14/13 153 lb (69.4 kg)     HEENT: nl dentition, turbinates, and orophanx. Nl external ear canals without cough reflex   NECK :  without JVD/Nodes/TM/ nl carotid upstrokes bilaterally   LUNGS: no acc muscle use, clear to A and P bilaterally without cough on insp or exp maneuvers   CV:  RRR  no s3 or murmur or increase in P2, no edema   ABD:  soft and nontender with nl excursion in the supine position. No bruits or organomegaly, bowel sounds nl  MS:  warm without deformities, calf tenderness, cyanosis or clubbing  SKIN: warm and dry without lesions    NEURO:  alert, approp, no deficits     CT chest 01/23/13 No findings to explain the questioned abnormality on recent chest  radiograph.      Assessment & Plan:

## 2013-03-20 NOTE — Patient Instructions (Addendum)
Symbicort 80 Take 2 puffs first thing in am and then another 2 puffs about 12 hours later.   For excess cough > mucinex dm 1200 mg every 12 hours as needed   Prednisone 10 mg take  4 each am x 2 days,   2 each am x 2 days,  1 each am x 2 days and stop   Prilosec 20 mg  X 2 Take 30-60 min before first meal of the day  X 2 weeks  GERD (REFLUX)  is an extremely common cause of respiratory symptoms, many times with no significant heartburn at all.    It can be treated with medication, but also with lifestyle changes including avoidance of late meals, excessive alcohol, smoking cessation, and avoid fatty foods, chocolate, peppermint, colas, red wine, and acidic juices such as orange juice.  NO MINT OR MENTHOL PRODUCTS SO NO COUGH DROPS  USE SUGARLESS CANDY INSTEAD (jolley ranchers or Stover's)  NO OIL BASED VITAMINS - use powdered substitutes.   Please schedule a follow up office visit in 2 weeks, sooner if needed

## 2013-03-21 ENCOUNTER — Encounter: Payer: Self-pay | Admitting: Internal Medicine

## 2013-03-21 ENCOUNTER — Encounter: Payer: BC Managed Care – PPO | Attending: Internal Medicine | Admitting: *Deleted

## 2013-03-21 ENCOUNTER — Telehealth: Payer: Self-pay | Admitting: Internal Medicine

## 2013-03-21 ENCOUNTER — Encounter: Payer: Self-pay | Admitting: *Deleted

## 2013-03-21 VITALS — Ht 64.0 in | Wt 155.1 lb

## 2013-03-21 DIAGNOSIS — E119 Type 2 diabetes mellitus without complications: Secondary | ICD-10-CM | POA: Insufficient documentation

## 2013-03-21 DIAGNOSIS — Z713 Dietary counseling and surveillance: Secondary | ICD-10-CM | POA: Insufficient documentation

## 2013-03-21 MED ORDER — PREDNISONE (PAK) 10 MG PO TABS: ORAL_TABLET | ORAL | Status: DC

## 2013-03-21 NOTE — Telephone Encounter (Signed)
Labs on 01/05/2013 done elsewhere showed an A1c of 6.7. No need for her A1c at this time, will check when she comes back by December.

## 2013-03-21 NOTE — Assessment & Plan Note (Signed)
DDX of  difficult airways managment all start with A and  include Adherence, Ace Inhibitors, Acid Reflux, Active Sinus Disease, Alpha 1 Antitripsin deficiency, Anxiety masquerading as Airways dz,  ABPA,  allergy(esp in young), Aspiration (esp in elderly), Adverse effects of DPI,  Active smokers, plus two Bs  = Bronchiectasis and Beta blocker use..and one C= CHF  Adherence is always the initial "prime suspect" and is a multilayered concern that requires a "trust but verify" approach in every patient - starting with knowing how to use medications, especially inhalers, correctly, keeping up with refills and understanding the fundamental difference between maintenance and prns vs those medications only taken for a very short course and then stopped and not refilled. The proper method of use, as well as anticipated side effects, of a metered-dose inhaler are discussed and demonstrated to the patient. Improved effectiveness after extensive coaching during this visit to a level of approximately  75% so try symbicort 80 2bid and regroup in 2 weeks  ? Acid (or non-acid) GERD > always difficult to exclude as up to 75% of pts in some series report no assoc GI/ Heartburn symptoms> rec max (24h)  acid suppression and diet restrictions/ reviewed and instructions given in writting   ? Allergies > consider RAST next ov  ? Sinus dz > consider sinus CT next ov     Each maintenance medication was reviewed in detail including most importantly the difference between maintenance and as needed and under what circumstances the prns are to be used.  Please see instructions for details which were reviewed in writing and the patient given a copy.

## 2013-03-21 NOTE — Progress Notes (Signed)
Appt start time: 0800 end time:  0930.   Assessment:  Patient was seen on  03/21/13 for individual diabetes education. Patient has been told by Dr. Drue Novel that she has T2DM with an A1c of 6.7%. Approximately one year prior her A1c was 5.7%. She is presently in denial of this diagnosis. She feels fine and isn't going to deprive herself and be miserable. She was having some significant GI distress that was determined to be related to HCTZ as well as having pancreatitis.. She is now over the GI distress. She feels that her pancreatitis which is resolved contributed to her elevated A1c. She does not "buy in" to the T2DM. She has a follow up appointment with Dr. Drue Novel in January. I would encourage that she request a current A1c since it has been three months since that last one. She does the grocery shopping. However she does not like to cook so she eats a lot of processed foods. Does not does fast food.  Current HbA1c: 6.7% 12/05/12  Preferred Learning Style:  Visual  Hands on  Learning Readiness:   Not ready  MEDICATIONS: See List..none for T2DM  DIETARY INTAKE:  Usual eating pattern includes 2 meals and 1 snacks per day. Usually skips one meal per day.  Everyday foods include lacks adequate protein and fiber intake.  Avoided foods include peas and brussel sprouts.    Usual physical activity: used to do Zumba regularly but has not done so in a few months.    Intervention:  Nutrition counseling provided.  Discussed diabetes disease process and treatment options.  Discussed physiology of diabetes and role of obesity on insulin resistance.  Encouraged moderate weight reduction to improve glucose levels.  Discussed role of medications and diet in glucose control  Provided education on macronutrients on glucose levels.  Provided education on carb counting, importance of regularly scheduled meals/snacks, and meal planning  Discussed effects of physical activity on glucose levels and long-term  glucose control.  Recommended 150 minutes of physical activity/week.  Discussed recommended target ranges and individual ranges.    Described short-term complications: hyper- and hypo-glycemia.  Discussed causes,symptoms, and treatment options.  Discussed prevention, detection, and treatment of long-term complications.  Discussed the role of prolonged elevated glucose levels on body systems.  Discussed role of stress on blood glucose levels and discussed strategies to manage psychosocial issues.  Discussed recommendations for long-term diabetes self-care.  Established checklist for medical, dental, and emotional self-care.  Teaching Method Utilized:  Visual Auditory Hands on  Handouts given during visit include: Living Well with Diabetes Carb Counting and Food Label handouts Meal Plan Card Plate method Snack Sheet   Barriers to learning/adherence to lifestyle change: does not buy into diagnosis of T2DM  Plan:  Have current A1c performed Aim for 3 Carb Choices per meal (45 grams) +/- 1 either way  Aim for 0-1 Carbs per snack if hungry  Consider reading food labels for Total Carbohydrate and Fat Grams of foods Consider  increasing your activity level by Zumba 3X weekly for 60 minutes daily as tolerated   Diabetes self-care support plan:   [facility] support group  Family  Demonstrated degree of understanding via:  Teach Back   Monitoring/Evaluation:  Dietary intake, exercise, and body weight prn.

## 2013-03-21 NOTE — Telephone Encounter (Signed)
Patient went to visit the nutritionist we referred her to today and was advised to have her A1C checked. Needs orders.

## 2013-03-22 NOTE — Telephone Encounter (Signed)
Done. DJR  

## 2013-03-22 NOTE — Telephone Encounter (Signed)
Spoke with pt. Pt states is not "convinced" that she has diabetes also that her nutritionist recommended her to have  A1c checked  2-3 months after her last one. Pt would like to have another A1c done before her apt with Dr. Drue Novel 06/05/12 to have some closure of the fact.Marland KitchenDJR

## 2013-03-22 NOTE — Addendum Note (Signed)
Addended by: Eustace Quail on: 03/22/2013 02:36 PM   Modules accepted: Orders

## 2013-03-22 NOTE — Telephone Encounter (Signed)
Janice Shaw, enter the orders for  a A1c--- DX diabetes, Let patient know we are rechecking

## 2013-03-23 ENCOUNTER — Other Ambulatory Visit (INDEPENDENT_AMBULATORY_CARE_PROVIDER_SITE_OTHER): Payer: BC Managed Care – PPO

## 2013-03-23 DIAGNOSIS — E119 Type 2 diabetes mellitus without complications: Secondary | ICD-10-CM

## 2013-03-27 ENCOUNTER — Telehealth: Payer: Self-pay | Admitting: Internal Medicine

## 2013-03-27 NOTE — Telephone Encounter (Addendum)
No, A1c reflects her sugar for the last 3 months. Arrange a visit if the patient likes to discuss further with me

## 2013-03-27 NOTE — Telephone Encounter (Signed)
Pt notified of A1c results. Pt states took a steroid on 03/21/13 then had her a1c checked 03/23/13, wants to know if that may have caused her a1c to increase.

## 2013-03-27 NOTE — Telephone Encounter (Signed)
Patient is calling back for lab results. Please advise.  °

## 2013-04-03 ENCOUNTER — Ambulatory Visit (INDEPENDENT_AMBULATORY_CARE_PROVIDER_SITE_OTHER): Payer: BC Managed Care – PPO | Admitting: Internal Medicine

## 2013-04-03 ENCOUNTER — Encounter: Payer: Self-pay | Admitting: Internal Medicine

## 2013-04-03 VITALS — BP 124/64 | HR 60 | Temp 98.0°F | Ht 63.5 in | Wt 161.0 lb

## 2013-04-03 DIAGNOSIS — J45991 Cough variant asthma: Secondary | ICD-10-CM

## 2013-04-03 MED ORDER — BUDESONIDE-FORMOTEROL FUMARATE 80-4.5 MCG/ACT IN AERO: 2.0000 | INHALATION_SPRAY | Freq: Two times a day (BID) | RESPIRATORY_TRACT | Status: DC

## 2013-04-03 NOTE — Patient Instructions (Addendum)
Stop prilosec now- if cough recurs w/in 2 weeks then you probably have some GERD driving the cough - other wise it's probably the cough that drove the reflux and you can wait until the cough erupts again to restart it twice daily before meals  Ok in 2 weeks to begin adjusting the symbicort 80 to 0-2 puff in am and 0-2 puffs 12 hours later    If you are satisfied with your treatment plan let your doctor know and he/she can either refill your medications or you can return here when your prescription runs out.     If in any way you are not 100% satisfied,  please tell us.  If 100% better, tell your friends!

## 2013-04-03 NOTE — Progress Notes (Signed)
Subjective:    Patient ID: Janice Shaw, female    DOB: 12-11-1960 MRN: [id number]    Brief patient profile:  52 yowf lots of bad coughs as child but doesn't remember missiing  School over it then smoked with tendency to bronchitis but got better p 2006 then relapsed July 2014 and present daily since until started back on symbicort with Pos Methacholine challenge 03/20/13    History of Present Illness  03/20/2013  Consultation/ 2nd opinion  Wert cc cough daily not waking up with it but within an hour of getting up notes it.  Also chest  Becomes tight cough at night, then returns to productive cough the next day.  Normally active person, now with minimal exertion (even talking on the phone) she has to "breathe deeper" and OOB with climbing stairs.  mct 03/20/13  which reproduced most of the symptoms then improved back to chronic baseline p saba rx.  Mucus is always a bit yellow. symbicort didn't really help - the only thing that does is tussionex rec symbicort 80 2bid For excess cough > mucinex dm 1200 mg every 12 hours as needed  Prednisone 10 mg take  4 each am x 2 days,   2 each am x 2 days,  1 each am x 2 days and stop  Prilosec 20 mg  X 2 Take 30-60 min before first meal of the day  X 2 weeks GERD diet   04/03/2013 f/u ov/Wert re:  Chronic cough since May 2014  Chief Complaint  Patient presents with  . Follow-up    Pt states that her cough is much improved- about 95% better. No new co's today.     No obvious day to day or daytime variabilty or assoc sob  or cp or chest tightness, subjective wheeze overt sinus or hb symptoms. No unusual exp hx or h/o childhood pna/ asthma or knowledge of premature birth.  Sleeping ok without nocturnal  or early am exacerbation  of respiratory  c/o's or need for noct saba. Also denies any obvious fluctuation of symptoms with weather or environmental changes or other aggravating or alleviating factors except as outlined above   Current  Medications, Allergies, Complete Past Medical History, Past Surgical History, Family History, and Social History were reviewed in [employer] record.  ROS  The following are not active complaints unless bolded sore throat, dysphagia, dental problems, itching, sneezing,  nasal congestion or excess/ purulent secretions, ear ache,   fever, chills, sweats, unintended wt loss, pleuritic or exertional cp, hemoptysis,  orthopnea pnd or leg swelling, presyncope, palpitations, heartburn, abdominal pain, anorexia, nausea, vomiting, diarrhea  or change in bowel or urinary habits, change in stools or urine, dysuria,hematuria,  rash, arthralgias, visual complaints, headache, numbness weakness or ataxia or problems with walking or coordination,  change in mood/affect or memory.                      Objective:   Physical Exam   Pleasant healthy appearing amb wf nad  Wt Readings from Last 3 Encounters:  04/03/13 161 lb (73.029 kg)  03/21/13 155 lb 1.6 oz (70.353 kg)  03/20/13 158 lb 6.4 oz (71.85 kg)        HEENT: nl dentition, turbinates, and orophanx. Nl external ear canals without cough reflex   NECK :  without JVD/Nodes/TM/ nl carotid upstrokes bilaterally   LUNGS: no acc muscle use, clear to A and P bilaterally without cough on insp or  exp maneuvers   CV:  RRR  no s3 or murmur or increase in P2, no edema   ABD:  soft and nontender with nl excursion in the supine position. No bruits or organomegaly, bowel sounds nl  MS:  warm without deformities, calf tenderness, cyanosis or clubbing  SKIN: warm and dry without lesions    NEURO:  alert, approp, no deficits     CT chest 01/23/13 No findings to explain the questioned abnormality on recent chest  radiograph.      Assessment & Plan:

## 2013-04-05 NOTE — Assessment & Plan Note (Signed)
-   POS MCT 03/20/13 reproduced most of her symptoms - hfa 75% p coaching 03/20/13 > try symbicort 80 2bid > improved 95% 04/03/13   All goals of chronic asthma control met including optimal function and elimination of symptoms with no need for rescue therapy.  It remains to be seen whether gerd was a primary or secondary problem so should stop gerd rx before considers backing off the symbicort rx  The proper method of use, as well as anticipated side effects, of a metered-dose inhaler are discussed and demonstrated to the patient. Improved effectiveness after extensive coaching during this visit to a level of approximately  90% now so she should do fine on hfa and could even step down eventually to qvar though harder to titrate given it takes about a week to work whereas symbicort starts working  in 5 min.  Pulmonary f/u is prn

## 2013-05-01 ENCOUNTER — Ambulatory Visit: Payer: BC Managed Care – PPO | Admitting: Internal Medicine

## 2013-06-05 ENCOUNTER — Ambulatory Visit (INDEPENDENT_AMBULATORY_CARE_PROVIDER_SITE_OTHER): Payer: BC Managed Care – PPO | Admitting: Internal Medicine

## 2013-06-05 ENCOUNTER — Encounter: Payer: Self-pay | Admitting: Internal Medicine

## 2013-06-05 VITALS — BP 130/74 | HR 60 | Temp 98.0°F | Wt 160.0 lb

## 2013-06-05 DIAGNOSIS — K859 Acute pancreatitis without necrosis or infection, unspecified: Secondary | ICD-10-CM

## 2013-06-05 DIAGNOSIS — E785 Hyperlipidemia, unspecified: Secondary | ICD-10-CM

## 2013-06-05 DIAGNOSIS — IMO0001 Reserved for inherently not codable concepts without codable children: Secondary | ICD-10-CM

## 2013-06-05 DIAGNOSIS — J45991 Cough variant asthma: Secondary | ICD-10-CM

## 2013-06-05 DIAGNOSIS — R030 Elevated blood-pressure reading, without diagnosis of hypertension: Secondary | ICD-10-CM

## 2013-06-05 DIAGNOSIS — E119 Type 2 diabetes mellitus without complications: Secondary | ICD-10-CM

## 2013-06-05 DIAGNOSIS — Z23 Encounter for immunization: Secondary | ICD-10-CM

## 2013-06-05 LAB — COMPREHENSIVE METABOLIC PANEL
ALT: 8 U/L (ref 0–35)
AST: 27 U/L (ref 0–37)
Albumin: 4.5 g/dL (ref 3.5–5.2)
Alkaline Phosphatase: 65 U/L (ref 39–117)
BILIRUBIN TOTAL: 0.6 mg/dL (ref 0.3–1.2)
BUN: 17 mg/dL (ref 6–23)
CO2: 28 meq/L (ref 19–32)
Calcium: 9.6 mg/dL (ref 8.4–10.5)
Chloride: 107 mEq/L (ref 96–112)
Creatinine, Ser: 0.7 mg/dL (ref 0.4–1.2)
GFR: 90.94 mL/min (ref >60.00)
Glucose, Bld: 119 mg/dL — ABNORMAL HIGH (ref 70–99)
Potassium: 4.2 mEq/L (ref 3.5–5.1)
SODIUM: 143 meq/L (ref 135–145)
TOTAL PROTEIN: 7.3 g/dL (ref 6.0–8.3)

## 2013-06-05 LAB — LIPID PANEL
CHOL/HDL RATIO: 3
Cholesterol: 235 mg/dL — ABNORMAL HIGH (ref 0–200)
HDL: 73.7 mg/dL (ref >39.00)
Triglycerides: 87 mg/dL (ref 0.0–149.0)
VLDL: 17.4 mg/dL (ref 0.0–40.0)

## 2013-06-05 LAB — LDL CHOLESTEROL, DIRECT: Direct LDL: 137.3 mg/dL

## 2013-06-05 LAB — LIPASE: LIPASE: 21 U/L (ref 11.0–59.0)

## 2013-06-05 LAB — AMYLASE: AMYLASE: 41 U/L (ref 27–131)

## 2013-06-05 LAB — HEMOGLOBIN A1C: HEMOGLOBIN A1C: 6.7 % — AB (ref 4.6–6.5)

## 2013-06-05 NOTE — Assessment & Plan Note (Addendum)
Saw a nutritionist, is a challenge to improve her lifestyle, diet and exercise discussed, see instructions, self education encourage. Feet care discussed

## 2013-06-05 NOTE — Patient Instructions (Signed)
Get your blood work before you leave   Next visit is for routine check up regards diabetes  in 3 months  No need to come back fasting Please make an appointment    The [facility] web site for Diabetes [email or web address]-rivera.net/  Two great books to learn about diabetes Diabetes fro Dummies "The [facility] Diabetes diet" book   Diabetes and Foot Care Diabetes may cause you to have problems because of poor blood supply (circulation) to your feet and legs. This may cause the skin on your feet to become thinner, break easier, and heal more slowly. Your skin may become dry, and the skin may peel and crack. You may also have nerve damage in your legs and feet causing decreased feeling in them. You may not notice minor injuries to your feet that could lead to infections or more serious problems. Taking care of your feet is one of the most important things you can do for yourself.  HOME CARE INSTRUCTIONS  Wear shoes at all times, even in the house. Do not go barefoot. Bare feet are easily injured.  Check your feet daily for blisters, cuts, and redness. If you cannot see the bottom of your feet, use a mirror or ask someone for help.  Wash your feet with warm water (do not use hot water) and mild soap. Then pat your feet and the areas between your toes until they are completely dry. Do not soak your feet as this can dry your skin.  Apply a moisturizing lotion or petroleum jelly (that does not contain alcohol and is unscented) to the skin on your feet and to dry, brittle toenails. Do not apply lotion between your toes.  Trim your toenails straight across. Do not dig under them or around the cuticle. File the edges of your nails with an emery board or nail file.  Do not cut corns or calluses or try to remove them with medicine.  Wear clean socks or stockings every day. Make sure they are not too tight. Do not wear knee-high stockings since  they may decrease blood flow to your legs.  Wear shoes that fit properly and have enough cushioning. To break in new shoes, wear them for just a few hours a day. This prevents you from injuring your feet. Always look in your shoes before you put them on to be sure there are no objects inside.  Do not cross your legs. This may decrease the blood flow to your feet.  If you find a minor scrape, cut, or break in the skin on your feet, keep it and the skin around it clean and dry. These areas may be cleansed with mild soap and water. Do not cleanse the area with peroxide, alcohol, or iodine.  When you remove an adhesive bandage, be sure not to damage the skin around it.  If you have a wound, look at it several times a day to make sure it is healing.  Do not use heating pads or hot water bottles. They may burn your skin. If you have lost feeling in your feet or legs, you may not know it is happening until it is too late.  Make sure your health care provider performs a complete foot exam at least annually or more often if you have foot problems. Report any cuts, sores, or bruises to your health care provider immediately. SEEK MEDICAL CARE IF:   You have an injury that is not healing.  You have cuts or breaks in  the skin.  You have an ingrown nail.  You notice redness on your legs or feet.  You feel burning or tingling in your legs or feet.  You have pain or cramps in your legs and feet.  Your legs or feet are numb.  Your feet always feel cold. SEEK IMMEDIATE MEDICAL CARE IF:   There is increasing redness, swelling, or pain in or around a wound.  There is a red line that goes up your leg.  Pus is coming from a wound.  You develop a fever or as directed by your health care provider.  You notice a bad smell coming from an ulcer or wound. Document Released: 05/08/2000 Document Revised: 01/11/2013 Document Reviewed: 10/18/2012 [facility] Patient Information 2014 [place].

## 2013-06-05 NOTE — Assessment & Plan Note (Addendum)
Diagnosis confirm after she saw pulmonary, symptoms currently under excellent control. Plan: Continue Symbicort, and GERD treatment.  consider step down treatment to steroids only (Qvar)  during the summer  Had a flu shot

## 2013-06-05 NOTE — Assessment & Plan Note (Signed)
On no medications, BP normal

## 2013-06-05 NOTE — Progress Notes (Signed)
   Subjective:    Patient ID: Janice Shaw, female    DOB: 29-Sep-1960, 53 y.o.   MRN: [id number]  HPI Here for a ROV,we discussed the following issues:  Asthma -- saw pulmonary, had PFTs w/ challenge, diagnosed was confirmed, note  from pulmonary reviewed. Diabetes-- saw the nutritionist, having some challenges following healthy diet. History of pancreatitis, due to check amylase and lipase. Hypertension--on no medications, BP today is normal, ambulatory BPs normal as well.  Past Medical History  Diagnosis Date  . GERD (gastroesophageal reflux disease)   . Elevated BP     took HCTZ  . Diabetes 02/14/2013  . Pancreatitis 02/14/2013    Due to HCTZ ? Saw Dr Collene Mares   . Asthma    Past Surgical History  Procedure Laterality Date  . Removal of vin    . Partial hysterectomy  2009  . Thrombosed hemorroid     History   Social History  . Marital Status: Married    Spouse Name: N/A    Number of Children: 0  . 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 Smoking status: Former Smoker -- 0.50 packs/day for 33 years    Types: Cigarettes    Quit date: 04/23/2005  . Smokeless tobacco: Never Used  . Alcohol Use: Yes     Comment: socially   . Drug Use: No  . Sexual Activity: Not on file   Other Topics Concern  . Not on file   Social History Narrative  . No narrative on file   Review of Systems Denies nausea, vomiting, diarrhea or abdominal pain. No chest pain or shortness or breath. Hardly ever cough w/ current medicines Denies lower extremity paresthesias but occasionally has charley horses/cramps    Objective:   Physical Exam  BP 130/74  Pulse 60  Temp(Src) 98 F (36.7 C)  Wt 160 lb (72.576 kg)  SpO2 99% General -- alert, well-developed, NAD.  Lungs -- normal respiratory effort, no intercostal retractions, no accessory muscle use, and normal breath sounds.  Heart-- normal rate, regular rhythm, no murmur.  DIABETIC  FEET EXAM: No lower extremity edema Normal pedal pulses bilaterally Skin normal, nails normal, no calluses Pinprick examination of the feet normal. Neurologic--  alert & oriented X3. Speech normal, gait normal, strength normal in all extremities.  Psych-- Cognition and judgment appear intact. Cooperative with normal attention span and concentration. No anxious or depressed appearing.      Assessment & Plan:

## 2013-06-05 NOTE — Progress Notes (Signed)
Pre visit review using our clinic review tool, if applicable. No additional management support is needed unless otherwise documented below in the visit note. 

## 2013-06-05 NOTE — Assessment & Plan Note (Signed)
Labs

## 2013-06-05 NOTE — Assessment & Plan Note (Signed)
Asx, labs

## 2013-06-06 ENCOUNTER — Telehealth: Payer: Self-pay

## 2013-06-06 NOTE — Telephone Encounter (Signed)
Relevant patient education assigned to patient using Emmi. ° °

## 2013-06-21 ENCOUNTER — Ambulatory Visit (INDEPENDENT_AMBULATORY_CARE_PROVIDER_SITE_OTHER): Payer: BC Managed Care – PPO | Admitting: Physician Assistant

## 2013-06-21 ENCOUNTER — Encounter: Payer: Self-pay | Admitting: Physician Assistant

## 2013-06-21 VITALS — BP 146/88 | HR 65 | Temp 97.7°F | Resp 16 | Ht 63.5 in | Wt 156.5 lb

## 2013-06-21 DIAGNOSIS — R8290 Unspecified abnormal findings in urine: Secondary | ICD-10-CM

## 2013-06-21 DIAGNOSIS — R109 Unspecified abdominal pain: Secondary | ICD-10-CM

## 2013-06-21 DIAGNOSIS — R1030 Lower abdominal pain, unspecified: Secondary | ICD-10-CM

## 2013-06-21 DIAGNOSIS — M545 Low back pain, unspecified: Secondary | ICD-10-CM

## 2013-06-21 DIAGNOSIS — R319 Hematuria, unspecified: Secondary | ICD-10-CM

## 2013-06-21 DIAGNOSIS — R300 Dysuria: Secondary | ICD-10-CM

## 2013-06-21 DIAGNOSIS — R309 Painful micturition, unspecified: Secondary | ICD-10-CM

## 2013-06-21 DIAGNOSIS — R82998 Other abnormal findings in urine: Secondary | ICD-10-CM

## 2013-06-21 LAB — CBC WITH DIFFERENTIAL/PLATELET
Basophils Absolute: 0 10*3/uL (ref 0.0–0.1)
Basophils Relative: 1 % (ref 0–1)
Eosinophils Absolute: 0.2 10*3/uL (ref 0.0–0.7)
Eosinophils Relative: 4 % (ref 0–5)
HCT: 41.5 % (ref 36.0–46.0)
Hemoglobin: 13.6 g/dL (ref 12.0–15.0)
LYMPHS ABS: 1.9 10*3/uL (ref 0.7–4.0)
LYMPHS PCT: 33 % (ref 12–46)
MCH: 28.6 pg (ref 26.0–34.0)
MCHC: 32.8 g/dL (ref 30.0–36.0)
MCV: 87.2 fL (ref 78.0–100.0)
MONOS PCT: 7 % (ref 3–12)
Monocytes Absolute: 0.4 10*3/uL (ref 0.1–1.0)
NEUTROS PCT: 55 % (ref 43–77)
Neutro Abs: 3.2 10*3/uL (ref 1.7–7.7)
Platelets: 223 10*3/uL (ref 150–400)
RBC: 4.76 MIL/uL (ref 3.87–5.11)
RDW: 13.2 % (ref 11.5–15.5)
WBC: 5.8 10*3/uL (ref 4.0–10.5)

## 2013-06-21 LAB — POCT URINALYSIS DIPSTICK
Glucose, UA: NEGATIVE
Leukocytes, UA: NEGATIVE
Nitrite, UA: NEGATIVE
UROBILINOGEN UA: 0.2
pH, UA: 6

## 2013-06-21 LAB — BASIC METABOLIC PANEL WITH GFR
BUN: 16 mg/dL (ref 6–23)
CALCIUM: 10.2 mg/dL (ref 8.4–10.5)
CHLORIDE: 104 meq/L (ref 96–112)
CO2: 30 meq/L (ref 19–32)
Creat: 0.8 mg/dL (ref 0.50–1.10)
GFR, Est African American: >89 mL/min
GFR, Est Non African American: 84 mL/min
GLUCOSE: 137 mg/dL — AB (ref 70–99)
Potassium: 5 mEq/L (ref 3.5–5.3)
SODIUM: 141 meq/L (ref 135–145)

## 2013-06-21 MED ORDER — CIPROFLOXACIN HCL 500 MG PO TABS: 500.0000 mg | ORAL_TABLET | Freq: Two times a day (BID) | ORAL | Status: DC

## 2013-06-21 MED ORDER — PHENAZOPYRIDINE HCL 95 MG PO TABS: 95.0000 mg | ORAL_TABLET | Freq: Three times a day (TID) | ORAL | Status: DC | PRN

## 2013-06-21 NOTE — Progress Notes (Signed)
Patient presents to clinic today c/o 1 days of hematuria, urinary hesitancy, dysuria and suprapubic pain.  Patient denies fever, chills, nausea or vomiting.  Denies flank pain or history of nephrolithiasis.  Endorses loose stools yesterday after a "bad" meal that have resolved.  Denies recent intercourse, vaginal pain, lesion or discharge.  Has only had one other UTI and states this feels similar.  Past Medical History  Diagnosis Date  . GERD (gastroesophageal reflux disease)   . Elevated BP     took HCTZ  . Diabetes 02/14/2013  . Pancreatitis 02/14/2013    Due to HCTZ ? Saw Dr Collene Mares   . Asthma     Current Outpatient Prescriptions on File Prior to Visit  Medication Sig Dispense Refill  . albuterol (PROAIR HFA) 108 (90 BASE) MCG/ACT inhaler Inhale 2 puffs into the lungs every 6 (six) hours as needed for wheezing.      . budesonide-formoterol (SYMBICORT) 80-4.5 MCG/ACT inhaler Inhale 2 puffs into the lungs 2 (two) times daily.  1 Inhaler  11  . Magnesium 500 MG CAPS Take 1 capsule by mouth daily.       Marland Kitchen omeprazole (PRILOSEC) 20 MG capsule Take 20 mg by mouth daily.      Marland Kitchen zolpidem (AMBIEN) 5 MG tablet Take 5 mg by mouth as needed for sleep.       No current facility-administered medications on file prior to visit.    Allergies  Allergen Reactions  . Codeine Hives and Itching  . Aspirin     unknown reaction, childhood allergy.  . Hctz [Hydrochlorothiazide]     ?pancreatitis  . Erythromycin Rash  . Penicillins Other (See Comments)    childhood allergy    Family History  Problem Relation Age of Onset  . Cancer Mother     non Hodgkins lymphoma  . Heart disease Mother     CHF  . Colon cancer Neg Hx   . Breast cancer Neg Hx     History   Social History  . Marital Status: Married    Spouse Name: N/A    Number of Children: 0  . 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 Smoking status: Former Smoker -- 0.50 packs/day for  33 years    Types: Cigarettes    Quit date: 04/23/2005  . Smokeless tobacco: Never Used  . Alcohol Use: Yes     Comment: socially   . Drug Use: No  . Sexual Activity: Not on file   Other Topics Concern  . Not on file   Social History Narrative  . No narrative on file   Review of Systems - See HPI.  All other ROS are negative.  There were no vitals filed for this visit.  Physical Exam  Vitals reviewed. Constitutional: She is oriented to person, place, and time and well-developed, well-nourished, and in no distress.  HENT:  Head: Normocephalic and atraumatic.  Eyes: Conjunctivae are normal.  Neck: Neck supple.  Cardiovascular: Normal rate, regular rhythm and normal heart sounds.   Pulmonary/Chest: Effort normal and breath sounds normal. No respiratory distress. She has no wheezes. She has no rales. She exhibits no tenderness.  Abdominal: Soft. Bowel sounds are normal. She exhibits no distension and no mass. There is no tenderness. There is no rebound and no guarding.  Negative CVA tenderness.  Neurological: She is alert and oriented to person, place, and time.  Skin: Skin is warm  and dry. No rash noted.  Psychiatric: Affect normal.    Recent Results (from the past 2160 hour(s))  COMPREHENSIVE METABOLIC PANEL     Status: Abnormal   Collection Time    06/05/13  9:46 AM      Result Value Range   Sodium 143  135 - 145 mEq/L   Potassium 4.2  3.5 - 5.1 mEq/L   Chloride 107  96 - 112 mEq/L   CO2 28  19 - 32 mEq/L   Glucose, Bld 119 (*) 70 - 99 mg/dL   BUN 17  6 - 23 mg/dL   Creatinine, Ser 0.7  0.4 - 1.2 mg/dL   Total Bilirubin 0.6  0.3 - 1.2 mg/dL   Alkaline Phosphatase 65  39 - 117 U/L   AST 27  0 - 37 U/L   ALT 8  0 - 35 U/L   Total Protein 7.3  6.0 - 8.3 g/dL   Albumin 4.5  3.5 - 5.2 g/dL   Calcium 9.6  8.4 - 10.5 mg/dL   GFR 90.94  >60.00 mL/min  LIPID PANEL     Status: Abnormal   Collection Time    06/05/13  9:46 AM      Result Value Range   Cholesterol 235 (*)  0 - 200 mg/dL   Comment: ATP III Classification       Desirable:  < 200 mg/dL               Borderline High:  200 - 239 mg/dL          High:  > = 240 mg/dL   Triglycerides 87.0  0.0 - 149.0 mg/dL   Comment: Normal:  <150 mg/dLBorderline High:  150 - 199 mg/dL   HDL 73.70  >39.00 mg/dL   VLDL 17.4  0.0 - 40.0 mg/dL   Total CHOL/HDL Ratio 3     Comment:                Men          Women1/2 Average Risk     3.4          3.3Average Risk          5.0          4.42X Average Risk          9.6          7.13X Average Risk          15.0          11.0                      HEMOGLOBIN A1C     Status: Abnormal   Collection Time    06/05/13  9:46 AM      Result Value Range   Hemoglobin A1C 6.7 (*) 4.6 - 6.5 %   Comment: Glycemic Control Guidelines for People with Diabetes:Non Diabetic:  <6%Goal of Therapy: <7%Additional Action Suggested:  >8%   AMYLASE     Status: None   Collection Time    06/05/13  9:46 AM      Result Value Range   Amylase 41  27 - 131 U/L  LIPASE     Status: None   Collection Time    06/05/13  9:46 AM      Result Value Range   Lipase 21.0  11.0 - 59.0 U/L  LDL CHOLESTEROL, DIRECT     Status: None   Collection Time  06/05/13  9:46 AM      Result Value Range   Direct LDL 137.3     Comment: Optimal:  <100 mg/dLNear or Above Optimal:  100-129 mg/dLBorderline High:  130-159 mg/dLHigh:  160-189 mg/dLVery High:  >190 mg/dL   Assessment/Plan: No problem-specific assessment & plan notes found for this encounter.

## 2013-06-21 NOTE — Patient Instructions (Signed)
Increase fluid intake.  Take antibiotic as prescribed.  Take a cranberry supplement and probiotic.  Go to the lab.  I will call you with all of your lab results.  You should notice improvement in your symptoms over the next 24 hours.  If symptoms worsen, please return to clinic or proceed to the ER if symptoms become severe or if you develop fever, chills or vomiting.

## 2013-06-21 NOTE — Assessment & Plan Note (Signed)
Urine dipstick positive for blood and leukocytes.  Will send for micro and culture.  Will empirically treat for UTI with Cipro 500 mg BID.  Will also obtain CBC and BMP to assess for renal function. Increase fluid intake, cranberry supplement and probiotic. If negative, will need to obtain CT abdomen/pelvis and possible referral to Urology.

## 2013-06-21 NOTE — Progress Notes (Signed)
Pre visit review using our clinic review tool, if applicable. No additional management support is needed unless otherwise documented below in the visit note/SLS  

## 2013-06-22 ENCOUNTER — Telehealth: Payer: Self-pay | Admitting: Physician Assistant

## 2013-06-22 LAB — CULTURE, URINE COMPREHENSIVE
COLONY COUNT: NO GROWTH
Organism ID, Bacteria: NO GROWTH

## 2013-06-22 LAB — URINALYSIS, ROUTINE W REFLEX MICROSCOPIC
Glucose, UA: NEGATIVE mg/dL
Nitrite: NEGATIVE
PROTEIN: 100 mg/dL — AB
Specific Gravity, Urine: 1.024 (ref 1.005–1.030)
Urobilinogen, UA: 0.2 mg/dL (ref 0.0–1.0)
pH: 5.5 (ref 5.0–8.0)

## 2013-06-22 LAB — URINALYSIS, MICROSCOPIC ONLY
BACTERIA UA: NONE SEEN
CASTS: NONE SEEN
CRYSTALS: NONE SEEN
SQUAMOUS EPITHELIAL / LPF: NONE SEEN

## 2013-06-22 NOTE — Telephone Encounter (Signed)
Spoke with patient concerning her results so far.  Urinalysis pos for blood and Leukocytes.  Micro negative for bacteria but positive for 3-6 RBC.  Culture pending.  Patient has started antibiotic.  Notes some improvement.  If culture negative, and symptoms persist, patient will need imaging and referral to urology for cystoscopy.

## 2013-06-23 ENCOUNTER — Encounter: Payer: Self-pay | Admitting: Internal Medicine

## 2013-09-04 ENCOUNTER — Ambulatory Visit: Payer: BC Managed Care – PPO | Admitting: Internal Medicine

## 2013-12-12 ENCOUNTER — Telehealth: Payer: Self-pay

## 2013-12-12 DIAGNOSIS — E785 Hyperlipidemia, unspecified: Secondary | ICD-10-CM

## 2013-12-12 NOTE — Telephone Encounter (Signed)
Diabetic bundle  mychart message sent  Pt needs to reschedule appt from cancelling the one in April  Needs LDL and BP recheck

## 2013-12-23 LAB — HM PAP SMEAR: HM Pap smear: NORMAL

## 2014-01-22 ENCOUNTER — Telehealth: Payer: Self-pay

## 2014-01-22 NOTE — Telephone Encounter (Signed)
Scheduled follow up appointment with pt.   DM bundle pt.

## 2014-02-05 ENCOUNTER — Other Ambulatory Visit: Payer: Self-pay

## 2014-02-05 DIAGNOSIS — Z1231 Encounter for screening mammogram for malignant neoplasm of breast: Secondary | ICD-10-CM

## 2014-02-14 ENCOUNTER — Ambulatory Visit (INDEPENDENT_AMBULATORY_CARE_PROVIDER_SITE_OTHER): Payer: BC Managed Care – PPO | Admitting: Internal Medicine

## 2014-02-14 ENCOUNTER — Encounter: Payer: Self-pay | Admitting: Internal Medicine

## 2014-02-14 VITALS — BP 118/64 | HR 44 | Temp 97.9°F | Wt 171.0 lb

## 2014-02-14 DIAGNOSIS — K219 Gastro-esophageal reflux disease without esophagitis: Secondary | ICD-10-CM | POA: Insufficient documentation

## 2014-02-14 DIAGNOSIS — J45991 Cough variant asthma: Secondary | ICD-10-CM

## 2014-02-14 DIAGNOSIS — E785 Hyperlipidemia, unspecified: Secondary | ICD-10-CM

## 2014-02-14 DIAGNOSIS — E119 Type 2 diabetes mellitus without complications: Secondary | ICD-10-CM

## 2014-02-14 MED ORDER — OMEPRAZOLE 20 MG PO CPDR: 20.0000 mg | DELAYED_RELEASE_CAPSULE | Freq: Every day | ORAL | Status: DC

## 2014-02-14 NOTE — Assessment & Plan Note (Signed)
Due for labs

## 2014-02-14 NOTE — Assessment & Plan Note (Signed)
prolonged discussion about diet and exercise, counseled provided. She already saw a nutritionist Plan: Labs, see instruction

## 2014-02-14 NOTE — Progress Notes (Signed)
   Subjective:    Patient ID: Janice Shaw, female    DOB: Dec 05, 1960, 53 y.o.   MRN: [id number]  DOS:  02/14/2014 Type of visit - description : Routine visit Interval history: Diabetes, needs to do better with diet and exercise, has gained some weight Asthma, good compliance with Symbicort, very rarely use rescue inhalers insomnia well-controlled, uses Ambien rarely. GERD, needs s a prescription for Prilosec  ROS Denies chest pain, difficulty breathing or lower extremity edema No nausea, vomiting, diarrhea  Past Medical History  Diagnosis Date  . GERD (gastroesophageal reflux disease)   . Elevated BP     took HCTZ  . Diabetes 02/14/2013  . Pancreatitis 02/14/2013    Due to HCTZ ? Saw Dr Collene Mares   . Asthma     Past Surgical History  Procedure Laterality Date  . Removal of vin    . Partial hysterectomy  2009  . Thrombosed hemorroid      History   Social History  . Marital Status: Married    Spouse Name: N/A    Number of Children: 0  . 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 Smoking status: Former Smoker -- 0.50 packs/day for 33 years    Types: Cigarettes    Quit date: 04/23/2005  . Smokeless tobacco: Never Used  . Alcohol Use: Yes     Comment: socially   . Drug Use: No  . Sexual Activity: Not on file   Other Topics Concern  . Not on file   Social History Narrative  . No narrative on file        Medication List       This list is accurate as of: 02/14/14 11:59 PM.  Always use your most recent med list.               budesonide-formoterol 80-4.5 MCG/ACT inhaler  Commonly known as:  SYMBICORT  Inhale 2 puffs into the lungs 2 (two) times daily.     ESTRING 2 MG vaginal ring  Generic drug:  estradiol  Place 1 each vaginally every 3 (three) months.     Magnesium 500 MG Caps  Take 1 capsule by mouth daily.     omeprazole 20 MG capsule  Commonly known as:  PRILOSEC  Take 1 capsule (20 mg  total) by mouth daily.     PROAIR HFA 108 (90 BASE) MCG/ACT inhaler  Generic drug:  albuterol  Inhale 2 puffs into the lungs every 6 (six) hours as needed for wheezing.     zolpidem 5 MG tablet  Commonly known as:  AMBIEN  Take 5 mg by mouth as needed for sleep.           Objective:   Physical Exam BP 118/64  Pulse 44  Temp(Src) 97.9 F (36.6 C) (Oral)  Wt 171 lb (77.565 kg)  SpO2 97% General -- alert, well-developed, NAD.  Neck --no thyromegaly  HEENT-- Not pale.   Lungs -- normal respiratory effort, no intercostal retractions, no accessory muscle use, and normal breath sounds.  Heart-- normal rate, regular rhythm, no murmur.  Extremities-- no pretibial edema bilaterally  Neurologic--  alert & oriented X3. Speech normal, gait appropriate for age, strength symmetric and appropriate for age.  Psych-- Cognition and judgment appear intact. Cooperative with normal attention span and concentration. No anxious or depressed appearing.     Assessment & Plan:

## 2014-02-14 NOTE — Assessment & Plan Note (Addendum)
Reports she will get a flu shot at the pharmacy  Well-controlled with Symbicort

## 2014-02-14 NOTE — Progress Notes (Signed)
Pre visit review using our clinic review tool, if applicable. No additional management support is needed unless otherwise documented below in the visit note. 

## 2014-02-14 NOTE — Patient Instructions (Addendum)
  Stop by the front desk and schedule labs to be done within few days (fasting): FLP  A1C  BMP  microalbumin -- dx DM    Please come back to the office in 6 months for a  Physical exam . Come back fasting    Stop by the front desk and schedule the visit

## 2014-02-14 NOTE — Assessment & Plan Note (Signed)
Refill PPIs

## 2014-02-15 ENCOUNTER — Ambulatory Visit
Admission: RE | Admit: 2014-02-15 | Discharge: 2014-02-15 | Disposition: A | Discharge status: Home / Self Care | Payer: BC Managed Care – PPO | Source: Ambulatory Visit

## 2014-02-15 DIAGNOSIS — Z1231 Encounter for screening mammogram for malignant neoplasm of breast: Secondary | ICD-10-CM

## 2014-02-19 ENCOUNTER — Other Ambulatory Visit (INDEPENDENT_AMBULATORY_CARE_PROVIDER_SITE_OTHER): Payer: BC Managed Care – PPO

## 2014-02-19 DIAGNOSIS — E088 Diabetes mellitus due to underlying condition with unspecified complications: Secondary | ICD-10-CM

## 2014-02-19 DIAGNOSIS — E138 Other specified diabetes mellitus with unspecified complications: Secondary | ICD-10-CM

## 2014-02-19 LAB — LIPID PANEL
CHOL/HDL RATIO: 3
Cholesterol: 228 mg/dL — ABNORMAL HIGH (ref 0–200)
HDL: 70 mg/dL (ref >39.00)
LDL CALC: 138 mg/dL — AB (ref 0–99)
NONHDL: 158
Triglycerides: 102 mg/dL (ref 0.0–149.0)
VLDL: 20.4 mg/dL (ref 0.0–40.0)

## 2014-02-19 LAB — BASIC METABOLIC PANEL
BUN: 12 mg/dL (ref 6–23)
CALCIUM: 9.6 mg/dL (ref 8.4–10.5)
CO2: 30 mEq/L (ref 19–32)
Chloride: 103 mEq/L (ref 96–112)
Creatinine, Ser: 0.7 mg/dL (ref 0.4–1.2)
GFR: 92.2 mL/min (ref >60.00)
Glucose, Bld: 139 mg/dL — ABNORMAL HIGH (ref 70–99)
Potassium: 4.2 mEq/L (ref 3.5–5.1)
Sodium: 140 mEq/L (ref 135–145)

## 2014-02-19 LAB — MICROALBUMIN / CREATININE URINE RATIO
Creatinine,U: 28.6 mg/dL
MICROALB/CREAT RATIO: 0.7 mg/g (ref 0.0–30.0)
Microalb, Ur: 0.2 mg/dL (ref 0.0–1.9)

## 2014-02-19 LAB — HEMOGLOBIN A1C: HEMOGLOBIN A1C: 6.5 % (ref 4.6–6.5)

## 2014-03-09 ENCOUNTER — Other Ambulatory Visit: Payer: Self-pay

## 2014-04-25 ENCOUNTER — Other Ambulatory Visit: Payer: Self-pay

## 2014-07-05 ENCOUNTER — Other Ambulatory Visit: Payer: Self-pay | Admitting: Internal Medicine

## 2014-07-10 ENCOUNTER — Other Ambulatory Visit: Payer: Self-pay

## 2014-07-10 ENCOUNTER — Encounter: Payer: Self-pay | Admitting: Internal Medicine

## 2014-07-10 MED ORDER — BUDESONIDE-FORMOTEROL FUMARATE 80-4.5 MCG/ACT IN AERO: 2.0000 | INHALATION_SPRAY | Freq: Two times a day (BID) | RESPIRATORY_TRACT | Status: DC

## 2014-07-24 LAB — HM DIABETES EYE EXAM

## 2014-08-22 ENCOUNTER — Telehealth: Payer: Self-pay | Admitting: *Deleted

## 2014-08-22 ENCOUNTER — Encounter: Payer: Self-pay | Admitting: *Deleted

## 2014-08-22 NOTE — Telephone Encounter (Signed)
Pre-Visit Call completed with patient and chart updated.   Pre-Visit Info documented in Specialty Comments under [employer].    

## 2014-08-23 ENCOUNTER — Encounter: Payer: Self-pay | Admitting: Internal Medicine

## 2014-08-23 ENCOUNTER — Ambulatory Visit (INDEPENDENT_AMBULATORY_CARE_PROVIDER_SITE_OTHER): Payer: BLUE CROSS/BLUE SHIELD | Admitting: Internal Medicine

## 2014-08-23 VITALS — BP 118/78 | HR 52 | Temp 98.1°F | Ht 64.0 in | Wt 152.4 lb

## 2014-08-23 DIAGNOSIS — E119 Type 2 diabetes mellitus without complications: Secondary | ICD-10-CM

## 2014-08-23 DIAGNOSIS — Z23 Encounter for immunization: Secondary | ICD-10-CM

## 2014-08-23 DIAGNOSIS — Z0000 Encounter for general adult medical examination without abnormal findings: Secondary | ICD-10-CM | POA: Diagnosis not present

## 2014-08-23 LAB — MICROALBUMIN / CREATININE URINE RATIO
CREATININE, U: 24.9 mg/dL
Microalb Creat Ratio: 2.8 mg/g (ref 0.0–30.0)
Microalb, Ur: <0.7 mg/dL (ref 0.0–1.9)

## 2014-08-23 LAB — HEMOGLOBIN A1C: Hgb A1c MFr Bld: 6.4 % (ref 4.6–6.5)

## 2014-08-23 LAB — BASIC METABOLIC PANEL
BUN: 13 mg/dL (ref 6–23)
CHLORIDE: 102 meq/L (ref 96–112)
CO2: 30 mEq/L (ref 19–32)
Calcium: 10.7 mg/dL — ABNORMAL HIGH (ref 8.4–10.5)
Creatinine, Ser: 0.83 mg/dL (ref 0.40–1.20)
GFR: 75.6 mL/min (ref >60.00)
Glucose, Bld: 115 mg/dL — ABNORMAL HIGH (ref 70–99)
Potassium: 4.2 mEq/L (ref 3.5–5.1)
Sodium: 138 mEq/L (ref 135–145)

## 2014-08-23 LAB — LIPID PANEL
CHOLESTEROL: 227 mg/dL — AB (ref 0–200)
HDL: 71.5 mg/dL (ref >39.00)
LDL Cholesterol: 141 mg/dL — ABNORMAL HIGH (ref 0–99)
NonHDL: 155.5
TRIGLYCERIDES: 71 mg/dL (ref 0.0–149.0)
Total CHOL/HDL Ratio: 3
VLDL: 14.2 mg/dL (ref 0.0–40.0)

## 2014-08-23 LAB — TSH: TSH: 2.72 u[IU]/mL (ref 0.35–4.50)

## 2014-08-23 MED ORDER — OMEPRAZOLE 20 MG PO CPDR: 20.0000 mg | DELAYED_RELEASE_CAPSULE | Freq: Every day | ORAL | Status: DC

## 2014-08-23 MED ORDER — BUDESONIDE-FORMOTEROL FUMARATE 80-4.5 MCG/ACT IN AERO: 2.0000 | INHALATION_SPRAY | Freq: Two times a day (BID) | RESPIRATORY_TRACT | Status: DC

## 2014-08-23 MED ORDER — DOXYCYCLINE HYCLATE 100 MG PO TBEC: 100.0000 mg | DELAYED_RELEASE_TABLET | Freq: Two times a day (BID) | ORAL | Status: DC

## 2014-08-23 MED ORDER — PREDNISONE 10 MG PO TABS: 10.0000 mg | ORAL_TABLET | Freq: Every day | ORAL | Status: DC

## 2014-08-23 NOTE — Progress Notes (Signed)
Pre visit review using our clinic review tool, if applicable. No additional management support is needed unless otherwise documented below in the visit note. 

## 2014-08-23 NOTE — Assessment & Plan Note (Signed)
Flu vaccine--02/28/14 Tdap--06/05/13 PNA-- today  Female care: Pap--12/2013- patient reported normal with Dr. Micah Noel [facility]--02/15/14 with Andres Shad, MD at [facility]: Neg  [facility]--patient reported 2009- polyps removed, f/u 10 years  Doing great with lifestyle  Other issues: Asthma, symptoms resurface 05-2014, Symbicort is helping to some extent. Atypical infection? Plan: Continue Symbicort, albuterol preexercise, prednisone and Zithromax. Call if not improving. Diabetes:   doing great with diet and exercise, had an eye Exam- 07/24/14 per patient microaneurysm behind right eye . Plan: aic micro

## 2014-08-23 NOTE — Patient Instructions (Signed)
Get your blood work before you leave   Continue Symbicort Use albuterol if you have cough or wheezing  , also before you exercise  Prednisone for 5 days Doxycycline for 7 days Call if her respiratory symptoms are not better.    Come back to the office in 4 months   for a routine check up

## 2014-08-23 NOTE — Progress Notes (Signed)
Subjective:    Patient ID: Janice Shaw, female    DOB: 09/18/1960, 54 y.o.   MRN: [id number]  DOS:  08/23/2014 Type of visit - description : cpx Interval history: Doing great, her diet has improved significantly, she started exercising, has lost a significant amount of weight   Review of Systems Constitutional: No fever, chills. No unexplained wt changes. No unusual sweats HEENT: No dental problems, ear discharge, facial swelling, voice changes. No eye discharge, redness or intolerance to light Respiratory:  She started to exercise in January, shortly after nnoted cough, occasional mucus production and wheezing. She started Symbicort and the symptoms decreased but they're not completely gone. No dyspnea on exertion. Cardiovascular: No CP, leg swelling or palpitations GI: no nausea, vomiting, diarrhea or abdominal pain. Flu vaccine--02/28/14  No blood in the stools. No dysphagia   Endocrine: No polyphagia, polyuria or polydipsia GU: No dysuria, gross hematuria, difficulty urinating. No urinary urgency or frequency. Musculoskeletal: No joint swellings or unusual aches or pains Skin: No change in the color of the skin, palor or rash Allergic, immunologic: No environmental allergies or food allergies Neurological: No dizziness or syncope. No headaches. No diplopia, slurred speech, motor deficits, facial numbness Hematological: No enlarged lymph nodes, easy bruising or bleeding Psychiatry: No suicidal ideas, hallucinations, behavior problems or confusion. No unusual/severe anxiety or depression.     Past Medical History  Diagnosis Date  . GERD (gastroesophageal reflux disease)   . Elevated BP     took HCTZ  . Diabetes 02/14/2013  . Pancreatitis 02/14/2013    Due to HCTZ ? Saw Dr Collene Mares   . Asthma     Past Surgical History  Procedure Laterality Date  . Removal of vin    . Partial hysterectomy  2009  . Thrombosed hemorroid      History   Social History  . Marital  Status: Married    Spouse Name: N/A  . Number of Children: 0  . 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 Smoking status: Former Smoker -- 0.50 packs/day for 33 years    Types: Cigarettes    Quit date: 04/23/2005  . Smokeless tobacco: Never Used  . Alcohol Use: Yes     Comment: socially   . Drug Use: No  . Sexual Activity: Not on file   Other Topics Concern  . Not on file   Social History Narrative     Family History  Problem Relation Age of Onset  . Cancer Mother     non Hodgkins lymphoma  . Heart disease Mother     CHF (due to chemo), no MI  . Colon cancer Neg Hx   . Breast cancer Neg Hx        Medication List       This list is accurate as of: 08/23/14  2:32 PM.  Always use your most recent med list.               budesonide-formoterol 80-4.5 MCG/ACT inhaler  Commonly known as:  SYMBICORT  Inhale 2 puffs into the lungs 2 (two) times daily.     doxycycline 100 MG EC tablet  Commonly known as:  DORYX  Take 1 tablet (100 mg total) by mouth 2 (two) times daily.     ESTROVERA PO  Take by mouth. OTC for menopausal sx     Magnesium 500 MG Caps  Take 1 capsule by mouth  daily.     omeprazole 20 MG capsule  Commonly known as:  PRILOSEC  Take 1 capsule (20 mg total) by mouth daily.     predniSONE 10 MG tablet  Commonly known as:  DELTASONE  Take 1 tablet (10 mg total) by mouth daily. 2 tabs a day x 5 days     PROAIR HFA 108 (90 BASE) MCG/ACT inhaler  Generic drug:  albuterol  Inhale 2 puffs into the lungs every 6 (six) hours as needed for wheezing.     zolpidem 5 MG tablet  Commonly known as:  AMBIEN  Take 5 mg by mouth as needed for sleep.           Objective:   Physical Exam BP 118/78 mmHg  Pulse 52  Temp(Src) 98.1 F (36.7 C) (Oral)  Ht 5\' 4"  (1.626 m)  Wt 152 lb 6 oz (69.117 kg)  BMI 26.14 kg/m2  SpO2 98% General:   Well developed, well nourished . NAD.  Neck:  Full range of  motion. Supple. No  thyromegaly , normal carotid pulse HEENT:  Normocephalic . Face symmetric, atraumatic Lungs:  Few rhonchi and end expiratory wheezing. No crackles. Normal respiratory effort, no intercostal retractions, no accessory muscle use. Heart: RRR,  no murmur.  Abdomen:  Not distended, soft, non-tender. No rebound or rigidity. No mass,organomegaly Muscle skeletal: no pretibial edema bilaterally  Skin: Exposed areas without rash. Not pale. Not jaundice Neurologic:  alert & oriented X3.  Speech normal, gait appropriate for age and unassisted Strength symmetric and appropriate for age.  Psych: Cognition and judgment appear intact.  Cooperative with normal attention span and concentration.  Behavior appropriate. No anxious or depressed appearing.       Assessment & Plan:

## 2014-08-29 ENCOUNTER — Other Ambulatory Visit: Payer: Self-pay

## 2014-09-20 ENCOUNTER — Other Ambulatory Visit: Payer: Self-pay

## 2014-11-19 ENCOUNTER — Other Ambulatory Visit: Payer: Self-pay

## 2014-12-26 ENCOUNTER — Encounter: Payer: Self-pay | Admitting: Internal Medicine

## 2014-12-26 ENCOUNTER — Ambulatory Visit (INDEPENDENT_AMBULATORY_CARE_PROVIDER_SITE_OTHER): Payer: BLUE CROSS/BLUE SHIELD | Admitting: Internal Medicine

## 2014-12-26 VITALS — BP 122/72 | HR 50 | Temp 97.7°F | Ht 64.0 in | Wt 143.2 lb

## 2014-12-26 DIAGNOSIS — J45991 Cough variant asthma: Secondary | ICD-10-CM

## 2014-12-26 DIAGNOSIS — E119 Type 2 diabetes mellitus without complications: Secondary | ICD-10-CM | POA: Diagnosis not present

## 2014-12-26 LAB — HM DIABETES FOOT EXAM: HM Diabetic Foot Exam: NORMAL

## 2014-12-26 LAB — HEMOGLOBIN A1C: HEMOGLOBIN A1C: 6.2 % (ref 4.6–6.5)

## 2014-12-26 NOTE — Progress Notes (Signed)
Pre visit review using our clinic review tool, if applicable. No additional management support is needed unless otherwise documented below in the visit note. 

## 2014-12-26 NOTE — Progress Notes (Signed)
Subjective:    Patient ID: Janice Shaw, female    DOB: 06/15/1960, 54 y.o.   MRN: [id number]  DOS:  12/26/2014 Type of visit - description :    Routine visit Interval history: Diabetes: Continue with a very healthy lifestyle Cough variant asthma:symptoms under excellent ctrol with Symbicor2 puffs daily Patient is concerned about recent elevated calcium level, would like to check a PTH.   Review of Systems  denies chest pain, difficulty breathing.  no lower extremity paresthesias  Past Medical History  Diagnosis Date  . GERD (gastroesophageal reflux disease)   . Elevated BP     took HCTZ  . Diabetes 02/14/2013  . Pancreatitis 02/14/2013    Due to HCTZ ? Saw Dr Collene Mares   . Asthma   . Dyslipidemia 02/14/2013    Past Surgical History  Procedure Laterality Date  . Removal of vin    . Partial hysterectomy  2009  . Thrombosed hemorroid      History   Social History  . Marital Status: Married    Spouse Name: N/A  . Number of Children: 0  . 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 Smoking status: Former Smoker -- 0.50 packs/day for 33 years    Types: Cigarettes    Quit date: 04/23/2005  . Smokeless tobacco: Never Used  . Alcohol Use: Yes     Comment: socially   . Drug Use: No  . Sexual Activity: Not on file   Other Topics Concern  . Not on file   Social History Narrative        Medication List       This list is accurate as of: 12/26/14 11:59 PM.  Always use your most recent med list.               budesonide-formoterol 80-4.5 MCG/ACT inhaler  Commonly known as:  SYMBICORT  Inhale 2 puffs into the lungs 2 (two) times daily.     ESTROVERA PO  Take by mouth. OTC for menopausal sx     Magnesium 500 MG Caps  Take 1 capsule by mouth daily.     omeprazole 20 MG capsule  Commonly known as:  PRILOSEC  Take 1 capsule (20 mg total) by mouth daily.     PROAIR HFA 108 (90 BASE) MCG/ACT inhaler    Generic drug:  albuterol  Inhale 2 puffs into the lungs every 6 (six) hours as needed for wheezing.     zolpidem 5 MG tablet  Commonly known as:  AMBIEN  Take 5 mg by mouth as needed for sleep.           Objective:   Physical Exam BP 122/72 mmHg  Pulse 50  Temp(Src) 97.7 F (36.5 C) (Oral)  Ht 5\' 4"  (1.626 m)  Wt 143 lb 4 oz (64.978 kg)  BMI 24.58 kg/m2  SpO2 96% General:   Well developed, well nourished . NAD.  HEENT:  Normocephalic . Face symmetric, atraumatic Lungs:  CTA B Normal respiratory effort, no intercostal retractions, no accessory muscle use. Heart: RRR,  no murmur.  No pretibial edema bilaterally  Diabetic foot exam: normal pedal pulses, no edema, pinprick examination normal Neurologic:  alert & oriented X3.  Speech normal, gait appropriate for age and unassisted Psych--  Cognition and judgment appear intact.  Cooperative with normal attention span and concentration.  Behavior appropriate. No anxious or depressed appearing.  Assessment & Plan:   Diabetes: Doing great, check A1c Diabetic foot exam negative. Cough variant asthma: Well controlled withSymbicort 2 puffs once daily, okay to use Symbicortwhen needed. Mild hypercalcemia: Recheck a calcium level, likes to rule out parathyroid disease, will check a PTH

## 2014-12-26 NOTE — Patient Instructions (Signed)
Get your blood work before you leave    

## 2014-12-27 LAB — PTH, INTACT AND CALCIUM
Calcium: 9.6 mg/dL (ref 8.4–10.5)
PTH: 43 pg/mL (ref 14–64)

## 2015-08-23 ENCOUNTER — Encounter: Payer: Self-pay | Admitting: Internal Medicine

## 2015-08-23 ENCOUNTER — Ambulatory Visit (INDEPENDENT_AMBULATORY_CARE_PROVIDER_SITE_OTHER): Payer: BLUE CROSS/BLUE SHIELD | Admitting: Internal Medicine

## 2015-08-23 VITALS — BP 112/66 | HR 47 | Temp 97.7°F | Ht 64.0 in | Wt 147.1 lb

## 2015-08-23 DIAGNOSIS — Z23 Encounter for immunization: Secondary | ICD-10-CM

## 2015-08-23 DIAGNOSIS — G4700 Insomnia, unspecified: Secondary | ICD-10-CM | POA: Diagnosis not present

## 2015-08-23 DIAGNOSIS — E785 Hyperlipidemia, unspecified: Secondary | ICD-10-CM

## 2015-08-23 DIAGNOSIS — Z09 Encounter for follow-up examination after completed treatment for conditions other than malignant neoplasm: Secondary | ICD-10-CM | POA: Insufficient documentation

## 2015-08-23 DIAGNOSIS — Z1159 Encounter for screening for other viral diseases: Secondary | ICD-10-CM | POA: Diagnosis not present

## 2015-08-23 DIAGNOSIS — E119 Type 2 diabetes mellitus without complications: Secondary | ICD-10-CM

## 2015-08-23 LAB — HEPATITIS C ANTIBODY: HCV AB: NEGATIVE

## 2015-08-23 LAB — MICROALBUMIN / CREATININE URINE RATIO
Creatinine,U: 21.2 mg/dL
Microalb Creat Ratio: 3.3 mg/g (ref 0.0–30.0)
Microalb, Ur: <0.7 mg/dL (ref 0.0–1.9)

## 2015-08-23 LAB — BASIC METABOLIC PANEL
BUN: 13 mg/dL (ref 6–23)
CHLORIDE: 101 meq/L (ref 96–112)
CO2: 31 mEq/L (ref 19–32)
Calcium: 10 mg/dL (ref 8.4–10.5)
Creatinine, Ser: 0.84 mg/dL (ref 0.40–1.20)
GFR: 74.3 mL/min (ref >60.00)
Glucose, Bld: 123 mg/dL — ABNORMAL HIGH (ref 70–99)
POTASSIUM: 4 meq/L (ref 3.5–5.1)
SODIUM: 138 meq/L (ref 135–145)

## 2015-08-23 LAB — LIPID PANEL
Cholesterol: 214 mg/dL — ABNORMAL HIGH (ref 0–200)
HDL: 77.9 mg/dL (ref >39.00)
LDL CALC: 120 mg/dL — AB (ref 0–99)
NonHDL: 136.54
TRIGLYCERIDES: 85 mg/dL (ref 0.0–149.0)
Total CHOL/HDL Ratio: 3
VLDL: 17 mg/dL (ref 0.0–40.0)

## 2015-08-23 LAB — HEMOGLOBIN A1C: Hgb A1c MFr Bld: 6.4 % (ref 4.6–6.5)

## 2015-08-23 MED ORDER — ZOLPIDEM TARTRATE 5 MG PO TABS: 5.0000 mg | ORAL_TABLET | ORAL | Status: DC | PRN

## 2015-08-23 MED ORDER — OMEPRAZOLE 20 MG PO CPDR: 20.0000 mg | DELAYED_RELEASE_CAPSULE | Freq: Every day | ORAL | Status: DC

## 2015-08-23 MED ORDER — BUDESONIDE-FORMOTEROL FUMARATE 80-4.5 MCG/ACT IN AERO: 2.0000 | INHALATION_SPRAY | Freq: Two times a day (BID) | RESPIRATORY_TRACT | Status: DC

## 2015-08-23 NOTE — Patient Instructions (Signed)
GO TO THE LAB :      Get the blood work     GO TO THE FRONT DESK Schedule your next appointment for a  physical When?   6 months Fasting?  No

## 2015-08-23 NOTE — Progress Notes (Signed)
Pre visit review using our clinic review tool, if applicable. No additional management support is needed unless otherwise documented below in the visit note. 

## 2015-08-23 NOTE — Assessment & Plan Note (Signed)
DM: Diet control, check A1c, BMP,  and microalbumin. Dirt and exercise discussed. Dyslipidemia: FLP Insomnia: Refill Ambien, uses when necessary. Primary care: Prevnar today, start aspirin, benefits discussed. Likes a hep C screening. Aware of potential cost. RTC 6 months

## 2015-08-23 NOTE — Progress Notes (Signed)
Subjective:    Patient ID: Janice Shaw, female    DOB: 03/07/1961, 55 y.o.   MRN: [id number]  DOS:  08/23/2015 Type of visit - description :Routine checkup Interval history:  DM: On no medications, doing well with diet and exercise most times Asthma, needs a refill, uses medications as needed Insomnia: Takes Ambien rarely, refill needed.    Review of Systems  no chest pain or difficulty breathing No nausea, vomiting, diarrhea  Past Medical History  Diagnosis Date  . GERD (gastroesophageal reflux disease)   . Elevated BP     took HCTZ  . Diabetes ([place]) 02/14/2013  . Pancreatitis 02/14/2013    Due to HCTZ ? Saw Dr Collene Mares   . Asthma   . Dyslipidemia 02/14/2013    Past Surgical History  Procedure Laterality Date  . Removal of vin    . Partial hysterectomy  2009  . Thrombosed hemorroid      Social History   Social History  . Marital Status: Married    Spouse Name: N/A  . Number of Children: 0  . 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 Smoking status: Former Smoker -- 0.50 packs/day for 33 years    Types: Cigarettes    Quit date: 04/23/2005  . Smokeless tobacco: Never Used  . Alcohol Use: Yes     Comment: socially   . Drug Use: No  . Sexual Activity: Not on file   Other Topics Concern  . Not on file   Social History Narrative        Medication List       This list is accurate as of: 08/23/15  5:48 PM.  Always use your most recent med list.               aspirin 81 MG tablet  Take 81 mg by mouth daily.     budesonide-formoterol 80-4.5 MCG/ACT inhaler  Commonly known as:  SYMBICORT  Inhale 2 puffs into the lungs 2 (two) times daily.     ESTROVERA PO  Take by mouth. OTC for menopausal sx     Magnesium 500 MG Caps  Take 1 capsule by mouth daily.     omeprazole 20 MG capsule  Commonly known as:  PRILOSEC  Take 1 capsule (20 mg total) by mouth daily.     PROAIR HFA 108 (90 Base) MCG/ACT  inhaler  Generic drug:  albuterol  Inhale 2 puffs into the lungs every 6 (six) hours as needed for wheezing. Reported on 08/23/2015     zolpidem 5 MG tablet  Commonly known as:  AMBIEN  Take 1 tablet (5 mg total) by mouth as needed for sleep. Reported on 08/23/2015           Objective:   Physical Exam BP 112/66 mmHg  Pulse 47  Temp(Src) 97.7 F (36.5 C) (Oral)  Ht 5\' 4"  (1.626 m)  Wt 147 lb 2 oz (66.735 kg)  BMI 25.24 kg/m2  SpO2 99% General:   Well developed, well nourished . NAD.  HEENT:  Normocephalic . Face symmetric, atraumatic Lungs:  CTA B Normal respiratory effort, no intercostal retractions, no accessory muscle use. Heart: RRR,  no murmur.  No pretibial edema bilaterally  Skin: Not pale. Not jaundice Neurologic:  alert & oriented X3.  Speech normal, gait appropriate for age and unassisted Psych--  Cognition and judgment appear intact.  Cooperative with normal attention span  and concentration.  Behavior appropriate. No anxious or depressed appearing.      Assessment & Plan:   Assessment DM Elevated BP (no hctz) Dyslipidemia Insomnia  GERD Cough variant Asthma Pancreatitis 2014, d/t HCTZ? Dr. Collene Mares  Plan:  DM: Diet control, check A1c, BMP,  and microalbumin. Dirt and exercise discussed. Dyslipidemia: FLP Insomnia: Refill Ambien, uses when necessary. Primary care: Prevnar today, start aspirin, benefits discussed. Likes a hep C screening. Aware of potential cost. RTC 6 months

## 2015-09-18 ENCOUNTER — Other Ambulatory Visit: Payer: Self-pay | Admitting: Internal Medicine

## 2015-09-18 NOTE — Telephone Encounter (Signed)
Medication Detail      Disp Refills Start End     omeprazole (PRILOSEC) 20 MG capsule 90 capsule 1 08/23/2015     Sig - Route: Take 1 capsule (20 mg total) by mouth daily. - Oral    Class: Print     Pharmacy    [id number] - [place], [place] - [place], too soon/SLS 04/26

## 2016-02-26 ENCOUNTER — Encounter: Payer: Self-pay | Admitting: Internal Medicine

## 2016-02-26 ENCOUNTER — Ambulatory Visit (INDEPENDENT_AMBULATORY_CARE_PROVIDER_SITE_OTHER): Payer: BLUE CROSS/BLUE SHIELD | Admitting: Internal Medicine

## 2016-02-26 VITALS — BP 124/72 | HR 76 | Temp 98.2°F | Resp 12 | Ht 64.0 in | Wt 149.4 lb

## 2016-02-26 DIAGNOSIS — E119 Type 2 diabetes mellitus without complications: Secondary | ICD-10-CM

## 2016-02-26 DIAGNOSIS — Z0000 Encounter for general adult medical examination without abnormal findings: Secondary | ICD-10-CM

## 2016-02-26 DIAGNOSIS — Z114 Encounter for screening for human immunodeficiency virus [HIV]: Secondary | ICD-10-CM

## 2016-02-26 LAB — CBC WITH DIFFERENTIAL/PLATELET
BASOS PCT: 0.3 % (ref 0.0–3.0)
Basophils Absolute: 0 10*3/uL (ref 0.0–0.1)
EOS PCT: 1 % (ref 0.0–5.0)
Eosinophils Absolute: 0.1 10*3/uL (ref 0.0–0.7)
HEMATOCRIT: 40.4 % (ref 36.0–46.0)
HEMOGLOBIN: 13.6 g/dL (ref 12.0–15.0)
LYMPHS PCT: 13.7 % (ref 12.0–46.0)
Lymphs Abs: 0.8 10*3/uL (ref 0.7–4.0)
MCHC: 33.8 g/dL (ref 30.0–36.0)
MCV: 86.1 fl (ref 78.0–100.0)
Monocytes Absolute: 0.5 10*3/uL (ref 0.1–1.0)
Monocytes Relative: 8.9 % (ref 3.0–12.0)
Neutro Abs: 4.5 10*3/uL (ref 1.4–7.7)
Neutrophils Relative %: 76.1 % (ref 43.0–77.0)
Platelets: 182 10*3/uL (ref 150.0–400.0)
RBC: 4.69 Mil/uL (ref 3.87–5.11)
RDW: 12.5 % (ref 11.5–15.5)
WBC: 5.9 10*3/uL (ref 4.0–10.5)

## 2016-02-26 LAB — BASIC METABOLIC PANEL
BUN: 12 mg/dL (ref 6–23)
CHLORIDE: 101 meq/L (ref 96–112)
CO2: 29 mEq/L (ref 19–32)
Calcium: 9.5 mg/dL (ref 8.4–10.5)
Creatinine, Ser: 0.85 mg/dL (ref 0.40–1.20)
GFR: 73.16 mL/min (ref >60.00)
GLUCOSE: 125 mg/dL — AB (ref 70–99)
POTASSIUM: 4.2 meq/L (ref 3.5–5.1)
SODIUM: 137 meq/L (ref 135–145)

## 2016-02-26 LAB — LIPID PANEL
CHOLESTEROL: 188 mg/dL (ref 0–200)
HDL: 59.2 mg/dL (ref >39.00)
LDL CALC: 107 mg/dL — AB (ref 0–99)
NonHDL: 128.6
TRIGLYCERIDES: 106 mg/dL (ref 0.0–149.0)
Total CHOL/HDL Ratio: 3
VLDL: 21.2 mg/dL (ref 0.0–40.0)

## 2016-02-26 LAB — MICROALBUMIN / CREATININE URINE RATIO
Creatinine,U: 111.7 mg/dL
MICROALB/CREAT RATIO: 1.3 mg/g (ref 0.0–30.0)
Microalb, Ur: 1.5 mg/dL (ref 0.0–1.9)

## 2016-02-26 LAB — ALT: ALT: 8 U/L (ref 0–35)

## 2016-02-26 LAB — HEMOGLOBIN A1C: Hgb A1c MFr Bld: 6.2 % (ref 4.6–6.5)

## 2016-02-26 LAB — TSH: TSH: 2.24 u[IU]/mL (ref 0.35–4.50)

## 2016-02-26 LAB — AST: AST: 18 U/L (ref 0–37)

## 2016-02-26 LAB — HIV ANTIBODY (ROUTINE TESTING W REFLEX): HIV 1&2 Ab, 4th Generation: NONREACTIVE

## 2016-02-26 MED ORDER — ZOLPIDEM TARTRATE 10 MG PO TABS: 10.0000 mg | ORAL_TABLET | Freq: Every evening | ORAL | 5 refills | Status: DC | PRN

## 2016-02-26 NOTE — Assessment & Plan Note (Signed)
DM: Diet control, doing well. Check A1c, micro Dyslipidemia: Diet control, check A1c, LDL goal 100,  patient will consider medication. Insomnia: Well controlled, takes Ambien 10 mg at night, new prescription provided Asthma: On prn meds, doing well RTC 6 months

## 2016-02-26 NOTE — Assessment & Plan Note (Addendum)
Tdap--06/05/13; PNA 23 2016 ,   prevnar 2017  Will get a flu shot   Female care:  last Pap--12/2013, Dr. Micah Noel [facility]--02/15/14   rec to call gyn  [facility]--patient reported 2009- polyps removed, f/u 10 years  Doing great with lifestyle Labs: HIV, A1c, micro, BMP, LFTs, FLP, CBC, TSH, vitamin D

## 2016-02-26 NOTE — Patient Instructions (Signed)
GO TO THE LAB : Get the blood work     GO TO THE FRONT DESK Schedule your next appointment for a  routine checkup in 6 months  

## 2016-02-26 NOTE — Progress Notes (Signed)
Pre visit review using our clinic review tool, if applicable. No additional management support is needed unless otherwise documented below in the visit note. 

## 2016-02-26 NOTE — Progress Notes (Signed)
Subjective:    Patient ID: Janice Shaw, female    DOB: Apr 16, 1961, 55 y.o.   MRN: [id number]  DOS:  02/26/2016 Type of visit - description : cpx Interval history: No major concerns    Review of Systems Constitutional: No fever. No chills. No unexplained wt changes. No unusual sweats  HEENT: No dental problems, no ear discharge, no facial swelling, no voice changes. No eye discharge, no eye  redness , no  intolerance to light   Respiratory: No wheezing , no  difficulty breathing. No cough , no mucus production  Cardiovascular: No CP, no leg swelling , no  Palpitations  GI: no nausea, no vomiting, no diarrhea , no  abdominal pain.  No blood in the stools. No dysphagia, no odynophagia    Endocrine: No polyphagia, no polyuria , no polydipsia  GU: No dysuria, gross hematuria, difficulty urinating. No urinary urgency, no frequency.  Musculoskeletal: No joint swellings or unusual aches or pains  Skin: No change in the color of the skin, palor , no  Rash  Allergic, immunologic: No environmental allergies , no  food allergies  Neurological: No dizziness no  syncope. No headaches. No diplopia, no slurred, no slurred speech, no motor deficits, no facial  Numbness  Hematological: No enlarged lymph nodes, no easy bruising , no unusual bleedings  Psychiatry: No suicidal ideas, no hallucinations, no beavior problems, no confusion.  No unusual/severe anxiety, no depression    Past Medical History:  Diagnosis Date  . Asthma   . Diabetes ([place]) 02/14/2013  . Dyslipidemia 02/14/2013  . Elevated BP    took HCTZ  . GERD (gastroesophageal reflux disease)   . Pancreatitis 02/14/2013   Due to HCTZ ? Saw Dr Collene Mares     Past Surgical History:  Procedure Laterality Date  . PARTIAL HYSTERECTOMY  2009  . removal of VIN    . thrombosed hemorroid      Social History   Social History  . Marital status: Married    Spouse name: N/A  . Number of children: 0  . Years of education: N/A    Occupational History  . [employer] [place] History Main Topics  . Smoking status: Former Smoker    Packs/day: 0.50    Years: 33.00    Types: Cigarettes    Quit date: 04/23/2005  . Smokeless tobacco: Never Used  . Alcohol use Yes     Comment: socially   . Drug use: No  . Sexual activity: Not on file   Other Topics Concern  . Not on file   Social History Narrative  . No narrative on file     Family History  Problem Relation Age of Onset  . Cancer Mother     non Hodgkins lymphoma  . Heart disease Mother     CHF (due to chemo), no MI  . Colon cancer Neg Hx   . Breast cancer Neg Hx        Medication List       Accurate as of 02/26/16  8:16 AM. Always use your most recent med list.          aspirin 81 MG tablet Take 81 mg by mouth daily.   budesonide-formoterol 80-4.5 MCG/ACT inhaler Commonly known as:  SYMBICORT Inhale 2 puffs into the lungs 2 (two) times daily.   ESTROVERA PO Take by mouth. OTC for menopausal sx   Magnesium 500 MG Caps Take 1 capsule by mouth daily.  omeprazole 20 MG capsule Commonly known as:  PRILOSEC Take 1 capsule (20 mg total) by mouth daily.   PROAIR HFA 108 (90 Base) MCG/ACT inhaler Generic drug:  albuterol Inhale 2 puffs into the lungs every 6 (six) hours as needed for wheezing. Reported on 08/23/2015   zolpidem 5 MG tablet Commonly known as:  AMBIEN Take 1 tablet (5 mg total) by mouth as needed for sleep. Reported on 08/23/2015          Objective:   Physical Exam BP 124/72 (BP Location: Left Arm, Patient Position: Sitting, Cuff Size: Small)   Pulse 76   Temp 98.2 F (36.8 C) (Oral)   Resp 12   Ht 5\' 4"  (1.626 m)   Wt 149 lb 6 oz (67.8 kg)   BMI 25.64 kg/m  General:   Well developed, well nourished . NAD.  Neck: No  thyromegaly  HEENT:  Normocephalic . Face symmetric, atraumatic Lungs:  CTA B Normal respiratory effort, no intercostal retractions, no accessory muscle use. Heart: RRR,   no murmur.  No pretibial edema bilaterally  Abdomen:  Not distended, soft, non-tender. No rebound or rigidity.   Skin: Exposed areas without rash. Not pale. Not jaundice Neurologic:  alert & oriented X3.  Speech normal, gait appropriate for age and unassisted Strength symmetric and appropriate for age.  Psych: Cognition and judgment appear intact.  Cooperative with normal attention span and concentration.  Behavior appropriate. No anxious or depressed appearing.     Assessment & Plan:  Assessment DM Elevated BP (no hctz) Dyslipidemia Insomnia  GERD Cough variant Asthma, seasonal  Pancreatitis 2014, d/t HCTZ? Dr. Collene Mares  PLAN: DM: Diet control, doing well. Check A1c, micro Dyslipidemia: Diet control, check A1c, LDL goal 100,  patient will consider medication. Insomnia: Well controlled, takes Ambien 10 mg at night, new prescription provided Asthma: On prn meds, doing well RTC 6 months

## 2016-02-29 LAB — VITAMIN D 1,25 DIHYDROXY
Vitamin D 1, 25 (OH)2 Total: 55 pg/mL (ref 18–72)
Vitamin D3 1, 25 (OH)2: 55 pg/mL

## 2016-03-10 DIAGNOSIS — Z23 Encounter for immunization: Secondary | ICD-10-CM | POA: Diagnosis not present

## 2016-03-18 ENCOUNTER — Other Ambulatory Visit: Payer: Self-pay | Admitting: Internal Medicine

## 2016-03-20 DIAGNOSIS — Z01411 Encounter for gynecological examination (general) (routine) with abnormal findings: Secondary | ICD-10-CM | POA: Diagnosis not present

## 2016-03-20 DIAGNOSIS — Z124 Encounter for screening for malignant neoplasm of cervix: Secondary | ICD-10-CM | POA: Diagnosis not present

## 2016-06-19 DIAGNOSIS — N898 Other specified noninflammatory disorders of vagina: Secondary | ICD-10-CM | POA: Diagnosis not present

## 2016-06-19 DIAGNOSIS — F419 Anxiety disorder, unspecified: Secondary | ICD-10-CM | POA: Diagnosis not present

## 2016-06-23 DIAGNOSIS — F39 Unspecified mood [affective] disorder: Secondary | ICD-10-CM | POA: Diagnosis not present

## 2016-07-06 DIAGNOSIS — F39 Unspecified mood [affective] disorder: Secondary | ICD-10-CM | POA: Diagnosis not present

## 2016-08-13 DIAGNOSIS — F419 Anxiety disorder, unspecified: Secondary | ICD-10-CM | POA: Diagnosis not present

## 2016-08-13 DIAGNOSIS — N898 Other specified noninflammatory disorders of vagina: Secondary | ICD-10-CM | POA: Diagnosis not present

## 2016-08-27 ENCOUNTER — Ambulatory Visit (INDEPENDENT_AMBULATORY_CARE_PROVIDER_SITE_OTHER): Payer: BLUE CROSS/BLUE SHIELD | Admitting: Internal Medicine

## 2016-08-27 ENCOUNTER — Encounter: Payer: Self-pay | Admitting: Internal Medicine

## 2016-08-27 VITALS — BP 118/66 | HR 60 | Temp 98.0°F | Resp 12 | Ht 64.0 in | Wt 143.2 lb

## 2016-08-27 DIAGNOSIS — E119 Type 2 diabetes mellitus without complications: Secondary | ICD-10-CM

## 2016-08-27 DIAGNOSIS — E785 Hyperlipidemia, unspecified: Secondary | ICD-10-CM

## 2016-08-27 DIAGNOSIS — F419 Anxiety disorder, unspecified: Secondary | ICD-10-CM

## 2016-08-27 NOTE — Progress Notes (Signed)
Subjective:    Patient ID: Janice Shaw, female    DOB: [birth year], 58 y.o.   MRN: [id number]  DOS:  08/27/2016 Type of visit - description : rov Interval history: Since the last office visit, her gyn DX her with anxiety, prescribed Zoloft, doing much better. Lifestyle is healthy, has lost some weight. Not taking aspirin  Wt Readings from Last 3 Encounters:  08/27/16 143 lb 4 oz (65 kg)  02/26/16 149 lb 6 oz (67.8 kg)  08/23/15 147 lb 2 oz (66.7 kg)     Review of Systems No chest pain, difficulty breathing. No depression per se  Past Medical History:  Diagnosis Date  . Asthma   . Diabetes ([place]) 02/14/2013  . Dyslipidemia 02/14/2013  . Elevated BP    took HCTZ  . GERD (gastroesophageal reflux disease)   . Pancreatitis 02/14/2013   Due to HCTZ ? Saw Dr Collene Mares     Past Surgical History:  Procedure Laterality Date  . PARTIAL HYSTERECTOMY  2009   no oophorectomy  . removal of VIN    . thrombosed hemorroid      Social History   Social History  . Marital status: Married    Spouse name: N/A  . Number of children: 0  . 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 Smoking status: Former Smoker    Packs/day: 0.50    Years: 33.00    Types: Cigarettes    Quit date: 04/23/2005  . Smokeless tobacco: Never Used  . Alcohol use Yes     Comment: socially   . Drug use: No  . Sexual activity: Not on file   Other Topics Concern  . Not on file   Social History Narrative  . No narrative on file      Allergies as of 08/27/2016      Reactions   Codeine Hives, Itching   Aspirin    unknown reaction, childhood allergy.   Hctz [hydrochlorothiazide]    ?pancreatitis   Erythromycin Rash   Penicillins Other (See Comments)   childhood allergy      Medication List       Accurate as of 08/27/16 11:59 PM. Always use your most recent med list.          budesonide-formoterol 80-4.5 MCG/ACT  inhaler Commonly known as:  SYMBICORT Inhale 2 puffs into the lungs 2 (two) times daily.   ESTROVERA PO Take by mouth. OTC for menopausal sx   Magnesium 500 MG Caps Take 1 capsule by mouth daily.   omeprazole 20 MG capsule Commonly known as:  PRILOSEC Take 1 capsule (20 mg total) by mouth daily.   PROAIR HFA 108 (90 Base) MCG/ACT inhaler Generic drug:  albuterol Inhale 2 puffs into the lungs every 6 (six) hours as needed for wheezing. Reported on 08/23/2015   sertraline 25 MG tablet Commonly known as:  ZOLOFT Take 25 mg by mouth daily.   zolpidem 10 MG tablet Commonly known as:  AMBIEN Take 1 tablet (10 mg total) by mouth at bedtime as needed for sleep.          Objective:   Physical Exam BP 118/66 (BP Location: Right Arm, Patient Position: Sitting, Cuff Size: Small)   Pulse 60   Temp 98 F (36.7 C) (Oral)   Resp 12   Ht 5\' 4"  (1.626 m)   Wt 143 lb 4 oz (65 kg)  SpO2 98%   BMI 24.59 kg/m  General:   Well developed, well nourished . NAD.  HEENT:  Normocephalic . Face symmetric, atraumatic Skin: Not pale. Not jaundice Neurologic:  alert & oriented X3.  Speech normal, gait appropriate for age and unassisted Psych--  Cognition and judgment appear intact.  Cooperative with normal attention span and concentration.  Behavior appropriate. No anxious or depressed appearing.      Assessment & Plan:   Assessment DM Elevated BP (no hctz) Dyslipidemia Anxiety, Insomnia  GERD Cough variant Asthma, seasonal  Pancreatitis 2014, d/t HCTZ? Dr. Collene Mares  PLAN: DM: Diet control, last A1c satisfactory. High chol:: Diet control, last LDL 107. Anxiety, insomnia: Having anxiety related to her husband's health, went to gynecology, started Zoloft 05-2016, great improvement, the need to take Ambien has decreased. We had a long conversation about the issue, her husband is my patient as well. Not taking aspirin, benefits discussed, aspirin removed from med list RTC 02-2014 CPX   >> 15 min

## 2016-08-27 NOTE — Progress Notes (Signed)
Pre visit review using our clinic review tool, if applicable. No additional management support is needed unless otherwise documented below in the visit note. 

## 2016-08-27 NOTE — Patient Instructions (Signed)
  GO TO THE FRONT DESK Schedule your next appointment for a  Physical exam by 02-2017

## 2016-08-28 DIAGNOSIS — F419 Anxiety disorder, unspecified: Secondary | ICD-10-CM | POA: Insufficient documentation

## 2016-08-28 NOTE — Assessment & Plan Note (Signed)
DM: Diet control, last A1c satisfactory. High chol:: Diet control, last LDL 107. Anxiety, insomnia: Having anxiety related to her husband's health, went to gynecology, started Zoloft 05-2016, great improvement, the need to take Ambien has decreased. We had a long conversation about the issue, her husband is my patient as well. Not taking aspirin, benefits discussed, aspirin removed from med list RTC 02-2014 CPX

## 2017-01-11 ENCOUNTER — Other Ambulatory Visit: Payer: Self-pay | Admitting: Internal Medicine

## 2017-02-25 ENCOUNTER — Other Ambulatory Visit: Payer: Self-pay | Admitting: Internal Medicine

## 2017-03-10 ENCOUNTER — Ambulatory Visit (INDEPENDENT_AMBULATORY_CARE_PROVIDER_SITE_OTHER): Payer: BLUE CROSS/BLUE SHIELD | Admitting: Internal Medicine

## 2017-03-10 ENCOUNTER — Encounter: Payer: Self-pay | Admitting: Internal Medicine

## 2017-03-10 VITALS — BP 118/68 | HR 50 | Temp 97.9°F | Resp 14 | Ht 64.0 in | Wt 158.2 lb

## 2017-03-10 DIAGNOSIS — Z1211 Encounter for screening for malignant neoplasm of colon: Secondary | ICD-10-CM

## 2017-03-10 DIAGNOSIS — Z0000 Encounter for general adult medical examination without abnormal findings: Secondary | ICD-10-CM

## 2017-03-10 DIAGNOSIS — E119 Type 2 diabetes mellitus without complications: Secondary | ICD-10-CM | POA: Diagnosis not present

## 2017-03-10 DIAGNOSIS — Z1231 Encounter for screening mammogram for malignant neoplasm of breast: Secondary | ICD-10-CM | POA: Diagnosis not present

## 2017-03-10 LAB — CBC WITH DIFFERENTIAL/PLATELET
BASOS ABS: 0.1 10*3/uL (ref 0.0–0.1)
Basophils Relative: 1.1 % (ref 0.0–3.0)
EOS ABS: 0.2 10*3/uL (ref 0.0–0.7)
Eosinophils Relative: 4.8 % (ref 0.0–5.0)
HCT: 41.9 % (ref 36.0–46.0)
Hemoglobin: 13.6 g/dL (ref 12.0–15.0)
LYMPHS ABS: 1.4 10*3/uL (ref 0.7–4.0)
LYMPHS PCT: 29 % (ref 12.0–46.0)
MCHC: 32.4 g/dL (ref 30.0–36.0)
MCV: 90.4 fl (ref 78.0–100.0)
MONOS PCT: 7.7 % (ref 3.0–12.0)
Monocytes Absolute: 0.4 10*3/uL (ref 0.1–1.0)
NEUTROS ABS: 2.8 10*3/uL (ref 1.4–7.7)
Neutrophils Relative %: 57.4 % (ref 43.0–77.0)
Platelets: 170 10*3/uL (ref 150.0–400.0)
RBC: 4.63 Mil/uL (ref 3.87–5.11)
RDW: 13.1 % (ref 11.5–15.5)
WBC: 4.9 10*3/uL (ref 4.0–10.5)

## 2017-03-10 LAB — HEMOGLOBIN A1C: Hgb A1c MFr Bld: 6.4 % (ref 4.6–6.5)

## 2017-03-10 LAB — COMPREHENSIVE METABOLIC PANEL
ALT: 9 U/L (ref 0–35)
AST: 18 U/L (ref 0–37)
Albumin: 4.4 g/dL (ref 3.5–5.2)
Alkaline Phosphatase: 61 U/L (ref 39–117)
BILIRUBIN TOTAL: 0.4 mg/dL (ref 0.2–1.2)
BUN: 16 mg/dL (ref 6–23)
CHLORIDE: 103 meq/L (ref 96–112)
CO2: 29 meq/L (ref 19–32)
CREATININE: 0.74 mg/dL (ref 0.40–1.20)
Calcium: 9.6 mg/dL (ref 8.4–10.5)
GFR: 85.53 mL/min (ref >60.00)
GLUCOSE: 116 mg/dL — AB (ref 70–99)
Potassium: 4.6 mEq/L (ref 3.5–5.1)
SODIUM: 140 meq/L (ref 135–145)
Total Protein: 6.8 g/dL (ref 6.0–8.3)

## 2017-03-10 LAB — MICROALBUMIN / CREATININE URINE RATIO
CREATININE, U: 37.1 mg/dL
MICROALB UR: 0.1 mg/dL (ref 0.0–1.9)
MICROALB/CREAT RATIO: 0.3 mg/g (ref 0.0–30.0)

## 2017-03-10 LAB — LIPID PANEL
CHOL/HDL RATIO: 3
Cholesterol: 212 mg/dL — ABNORMAL HIGH (ref 0–200)
HDL: 69.3 mg/dL (ref >39.00)
LDL CALC: 123 mg/dL — AB (ref 0–99)
NONHDL: 142.85
Triglycerides: 97 mg/dL (ref 0.0–149.0)
VLDL: 19.4 mg/dL (ref 0.0–40.0)

## 2017-03-10 MED ORDER — SERTRALINE HCL 50 MG PO TABS: 50.0000 mg | ORAL_TABLET | Freq: Every day | ORAL | 3 refills | Status: DC

## 2017-03-10 MED ORDER — OMEPRAZOLE 20 MG PO CPDR: 20.0000 mg | DELAYED_RELEASE_CAPSULE | Freq: Every day | ORAL | 3 refills | Status: DC

## 2017-03-10 NOTE — Patient Instructions (Signed)
GO TO THE LAB : Get the blood work     GO TO THE FRONT DESK Schedule your next appointment for a   checkup in 8 months 

## 2017-03-10 NOTE — Progress Notes (Signed)
Subjective:    Patient ID: Janice Shaw, female    DOB: 1960/09/30, 56 y.o.   MRN: [id number]  DOS:  03/10/2017 Type of visit - description : cpx Interval history: No major concerns. Did self increase Zoloft to 50 mg, due to anxiety, that is working well for her. Sleeping better, rarely uses Ambien.   Wt Readings from Last 3 Encounters:  03/10/17 158 lb 4 oz (71.8 kg)  08/27/16 143 lb 4 oz (65 kg)  02/26/16 149 lb 6 oz (67.8 kg)     Review of Systems  Other than above, a 14 point review of systems is negative    Past Medical History:  Diagnosis Date  . Asthma   . Diabetes ([place]) 02/14/2013  . Dyslipidemia 02/14/2013  . Elevated BP    took HCTZ  . GERD (gastroesophageal reflux disease)   . Pancreatitis 02/14/2013   Due to HCTZ ? Saw Dr Collene Mares     Past Surgical History:  Procedure Laterality Date  . PARTIAL HYSTERECTOMY  2009   no oophorectomy  . removal of VIN    . thrombosed hemorroid      Social History   Social History  . Marital status: Married    Spouse name: N/A  . Number of children: 0  . Years of education: N/A   Occupational History  . 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 Smoking status: Former Smoker    Packs/day: 0.50    Years: 33.00    Types: Cigarettes    Quit date: 04/23/2005  . Smokeless tobacco: Never Used  . Alcohol use Yes     Comment: socially   . Drug use: No  . Sexual activity: Not on file   Other Topics Concern  . Not on file   Social History Narrative  . No narrative on file     Family History  Problem Relation Age of Onset  . Cancer Mother        non Hodgkins lymphoma  . Heart disease Mother        CHF (due to chemo), no MI  . Colon cancer Neg Hx   . Breast cancer Neg Hx      Allergies as of 03/10/2017      Reactions   Codeine Hives, Itching   Aspirin    unknown reaction, childhood allergy.   Hctz [hydrochlorothiazide]    ?pancreatitis   Erythromycin Rash   Penicillins Other  (See Comments)   childhood allergy      Medication List       Accurate as of 03/10/17 12:54 PM. Always use your most recent med list.          budesonide-formoterol 80-4.5 MCG/ACT inhaler Commonly known as:  SYMBICORT Inhale 2 puffs into the lungs 2 (two) times daily.   ESTROVERA PO Take by mouth. OTC for menopausal sx   Magnesium 500 MG Caps Take 1 capsule by mouth daily.   omeprazole 20 MG capsule Commonly known as:  PRILOSEC Take 1 capsule (20 mg total) by mouth daily.   PROAIR HFA 108 (90 Base) MCG/ACT inhaler Generic drug:  albuterol Inhale 2 puffs into the lungs every 6 (six) hours as needed for wheezing. Reported on 08/23/2015   PROBIOTIC DAILY PO Take 1 tablet by mouth daily.   sertraline 50 MG tablet Commonly known as:  ZOLOFT Take 1 tablet (50 mg total) by mouth daily.   vitamin C 500 MG tablet Commonly known as:  ASCORBIC ACID Take 500 mg by mouth daily.   zolpidem 10 MG tablet Commonly known as:  AMBIEN Take 1 tablet (10 mg total) by mouth at bedtime as needed for sleep.          Objective:   Physical Exam BP 118/68 (BP Location: Right Arm, Patient Position: Sitting, Cuff Size: Small)   Pulse (!) 50   Temp 97.9 F (36.6 C) (Oral)   Resp 14   Ht 5\' 4"  (1.626 m)   Wt 158 lb 4 oz (71.8 kg)   SpO2 91%   BMI 27.16 kg/m   General:   Well developed, well nourished . NAD.  Neck: No  thyromegaly  HEENT:  Normocephalic . Face symmetric, atraumatic Lungs:  CTA B Normal respiratory effort, no intercostal retractions, no accessory muscle use. Heart: RRR,  no murmur.  No pretibial edema bilaterally  Abdomen:  Not distended, soft, non-tender. No rebound or rigidity.   Skin: Exposed areas without rash. Not pale. Not jaundice DIABETIC FEET EXAM: No lower extremity edema Normal pedal pulses bilaterally Skin normal, nails normal, no calluses Pinprick examination of the feet normal. Neurologic:  alert & oriented X3.  Speech normal, gait  appropriate for age and unassisted Strength symmetric and appropriate for age.  Psych: Cognition and judgment appear intact.  Cooperative with normal attention span and concentration.  Behavior appropriate. No anxious or depressed appearing.    Assessment & Plan:    Assessment DM a1c  6.5 (2015) Elevated BP (no hctz) Dyslipidemia Anxiety, Insomnia  GERD Cough variant Asthma, seasonal  Pancreatitis 2014, d/t HCTZ? Dr. Collene Mares  PLAN: DM: Diet control, feet exam negative, reports she has regular eye exams. Check a A1c and micro Elevated BP: BP today is normal Dyslipidemia, diet control, check labs Anxiety insomnia: Self increase Zoloft to 50 mg, feeling better. Uses Ambien rarely. RF Ambien as needed RTC 8 months

## 2017-03-10 NOTE — Assessment & Plan Note (Signed)
-  Tdap 2015; PNA 23 2016 ,   prevnar 2017 ; to get a flu shot @ the pharmacy -Female care: last Pap, MMG 2015, Dr. Micah Noel, overdue states will call gyn, will [facility]--patient reported 2009- polyps removed, f/u 10 years; refer to Dr Collene Mares -Diet-exercise: gained some wt, working on it -Labs: CMP, FLP, CBC, A1c, micro

## 2017-03-10 NOTE — Assessment & Plan Note (Signed)
DM: Diet control, feet exam negative, reports she has regular eye exams. Check a A1c and micro Elevated BP: BP today is normal Dyslipidemia, diet control, check labs Anxiety insomnia: Self increase Zoloft to 50 mg, feeling better. Uses Ambien rarely. RF Ambien as needed RTC 8 months

## 2017-03-10 NOTE — Progress Notes (Signed)
Pre visit review using our clinic review tool, if applicable. No additional management support is needed unless otherwise documented below in the visit note. 

## 2017-03-12 ENCOUNTER — Other Ambulatory Visit: Payer: Self-pay | Admitting: Internal Medicine

## 2017-03-12 DIAGNOSIS — Z1231 Encounter for screening mammogram for malignant neoplasm of breast: Secondary | ICD-10-CM

## 2017-03-18 ENCOUNTER — Encounter: Payer: Self-pay | Admitting: Internal Medicine

## 2017-03-30 ENCOUNTER — Ambulatory Visit
Admission: RE | Admit: 2017-03-30 | Discharge: 2017-03-30 | Disposition: A | Discharge status: Home / Self Care | Payer: BLUE CROSS/BLUE SHIELD | Source: Ambulatory Visit | Attending: Internal Medicine | Admitting: Internal Medicine

## 2017-03-30 DIAGNOSIS — Z1231 Encounter for screening mammogram for malignant neoplasm of breast: Secondary | ICD-10-CM | POA: Diagnosis not present

## 2017-07-22 DIAGNOSIS — K219 Gastro-esophageal reflux disease without esophagitis: Secondary | ICD-10-CM | POA: Diagnosis not present

## 2017-07-22 DIAGNOSIS — Z1211 Encounter for screening for malignant neoplasm of colon: Secondary | ICD-10-CM | POA: Diagnosis not present

## 2017-07-22 DIAGNOSIS — K5900 Constipation, unspecified: Secondary | ICD-10-CM | POA: Diagnosis not present

## 2017-08-11 DIAGNOSIS — D128 Benign neoplasm of rectum: Secondary | ICD-10-CM | POA: Diagnosis not present

## 2017-08-11 DIAGNOSIS — D125 Benign neoplasm of sigmoid colon: Secondary | ICD-10-CM | POA: Diagnosis not present

## 2017-08-11 DIAGNOSIS — D122 Benign neoplasm of ascending colon: Secondary | ICD-10-CM | POA: Diagnosis not present

## 2017-08-11 DIAGNOSIS — Z1211 Encounter for screening for malignant neoplasm of colon: Secondary | ICD-10-CM | POA: Diagnosis not present

## 2017-08-11 DIAGNOSIS — K621 Rectal polyp: Secondary | ICD-10-CM | POA: Diagnosis not present

## 2017-08-11 DIAGNOSIS — K635 Polyp of colon: Secondary | ICD-10-CM | POA: Diagnosis not present

## 2017-08-11 LAB — HM COLONOSCOPY

## 2017-08-18 ENCOUNTER — Encounter: Payer: Self-pay | Admitting: Internal Medicine

## 2017-08-23 ENCOUNTER — Encounter: Payer: Self-pay | Admitting: Internal Medicine

## 2017-08-26 ENCOUNTER — Encounter: Payer: Self-pay | Admitting: Nurse Practitioner

## 2017-08-26 ENCOUNTER — Ambulatory Visit (INDEPENDENT_AMBULATORY_CARE_PROVIDER_SITE_OTHER): Payer: BLUE CROSS/BLUE SHIELD | Admitting: Nurse Practitioner

## 2017-08-26 VITALS — BP 130/78 | HR 66 | Temp 98.7°F | Ht 64.0 in | Wt 172.6 lb

## 2017-08-26 DIAGNOSIS — B349 Viral infection, unspecified: Secondary | ICD-10-CM | POA: Diagnosis not present

## 2017-08-26 DIAGNOSIS — R51 Headache: Secondary | ICD-10-CM

## 2017-08-26 DIAGNOSIS — R519 Headache, unspecified: Secondary | ICD-10-CM

## 2017-08-26 MED ORDER — KETOROLAC TROMETHAMINE 30 MG/ML IJ SOLN
30.0000 mg | Freq: Once | INTRAMUSCULAR | Status: AC
  Administered 2017-08-26: 30 mg via INTRAMUSCULAR

## 2017-08-26 MED ORDER — NAPROXEN 500 MG PO TABS: 500.0000 mg | ORAL_TABLET | Freq: Two times a day (BID) | ORAL | 0 refills | Status: DC | PRN

## 2017-08-26 NOTE — Progress Notes (Signed)
Subjective:  Patient ID: Janice Shaw, female    DOB: 08-Mar-1961  Age: 57 y.o. MRN: [id number]  CC: Headache (started a couple days ago. cough with production (brown) aches, fever last night)   Fever   This is a new problem. The current episode started in the past 7 days. The problem occurs constantly. The problem has been unchanged. Her temperature was unmeasured prior to arrival. Associated symptoms include congestion, coughing, headaches and muscle aches. Pertinent negatives include no abdominal pain, chest pain, diarrhea, ear pain, nausea, rash, sleepiness, sore throat, urinary pain, vomiting or wheezing. She has tried nothing for the symptoms.  Risk factors: no immunosuppression, no recent sickness, no recent travel and no sick contacts     Outpatient Medications Prior to Visit  Medication Sig Dispense Refill  . albuterol (PROAIR HFA) 108 (90 BASE) MCG/ACT inhaler Inhale 2 puffs into the lungs every 6 (six) hours as needed for wheezing. Reported on 08/23/2015    . budesonide-formoterol (SYMBICORT) 80-4.5 MCG/ACT inhaler Inhale 2 puffs into the lungs 2 (two) times daily. 10.2 Inhaler 5  . Magnesium 500 MG CAPS Take 1 capsule by mouth daily.     Marland Kitchen omeprazole (PRILOSEC) 20 MG capsule Take 1 capsule (20 mg total) by mouth daily. 90 capsule 3  . Probiotic Product (PROBIOTIC DAILY PO) Take 1 tablet by mouth daily.    . Rhubarb (ESTROVERA PO) Take by mouth. OTC for menopausal sx    . sertraline (ZOLOFT) 50 MG tablet Take 1 tablet (50 mg total) by mouth daily. 90 tablet 3  . vitamin C (ASCORBIC ACID) 500 MG tablet Take 500 mg by mouth daily.    Marland Kitchen zolpidem (AMBIEN) 10 MG tablet Take 1 tablet (10 mg total) by mouth at bedtime as needed for sleep. 30 tablet 5   No facility-administered medications prior to visit.     ROS See HPI  Objective:  BP 130/78 (BP Location: Right Arm, Patient Position: Sitting, Cuff Size: Normal)   Pulse 66   Temp 98.7 F (37.1 C) (Oral)   Ht 5\' 4"  (1.626 m)    Wt 172 lb 9.6 oz (78.3 kg)   SpO2 98%   BMI 29.63 kg/m   BP Readings from Last 3 Encounters:  08/26/17 130/78  03/10/17 118/68  08/27/16 118/66    Wt Readings from Last 3 Encounters:  08/26/17 172 lb 9.6 oz (78.3 kg)  03/10/17 158 lb 4 oz (71.8 kg)  08/27/16 143 lb 4 oz (65 kg)    Physical Exam  Constitutional: She is oriented to person, place, and time.  HENT:  Right Ear: Tympanic membrane, external ear and ear canal normal.  Left Ear: Tympanic membrane, external ear and ear canal normal.  Nose: No mucosal edema or rhinorrhea. Right sinus exhibits no maxillary sinus tenderness and no frontal sinus tenderness. Left sinus exhibits no maxillary sinus tenderness and no frontal sinus tenderness.  Mouth/Throat: Uvula is midline. No trismus in the jaw. Posterior oropharyngeal erythema present. No oropharyngeal exudate.  Eyes: No scleral icterus.  Neck: Normal range of motion. Neck supple.  Cardiovascular: Normal rate and normal heart sounds.  Pulmonary/Chest: Effort normal and breath sounds normal.  Musculoskeletal: She exhibits no edema.  Lymphadenopathy:    She has no cervical adenopathy.  Neurological: She is alert and oriented to person, place, and time.  Vitals reviewed.   Lab Results  Component Value Date   WBC 4.9 03/10/2017   HGB 13.6 03/10/2017   HCT 41.9 03/10/2017   PLT  170.0 03/10/2017   GLUCOSE 116 (H) 03/10/2017   CHOL 212 (H) 03/10/2017   TRIG 97.0 03/10/2017   HDL 69.30 03/10/2017   LDLDIRECT 137.3 06/05/2013   LDLCALC 123 (H) 03/10/2017   ALT 9 03/10/2017   AST 18 03/10/2017   NA 140 03/10/2017   K 4.6 03/10/2017   CL 103 03/10/2017   CREATININE 0.74 03/10/2017   BUN 16 03/10/2017   CO2 29 03/10/2017   TSH 2.24 02/26/2016   HGBA1C 6.4 03/10/2017   MICROALBUR 0.1 03/10/2017    Mm Digital Screening Bilateral  Result Date: 03/31/2017 CLINICAL DATA:  Screening. EXAM: DIGITAL SCREENING BILATERAL MAMMOGRAM WITH CAD COMPARISON:  Previous exam(s).  ACR Breast Density Category b: There are scattered areas of fibroglandular density. FINDINGS: There are no findings suspicious for malignancy. Images were processed with CAD. IMPRESSION: No mammographic evidence of malignancy. A result letter of this screening mammogram will be mailed directly to the patient. RECOMMENDATION: Screening mammogram in one year. (Code:SM-B-01Y) BI-RADS CATEGORY  1: Negative. Electronically Signed   By: Abelardo Diesel M.D.   On: 03/31/2017 08:01    Assessment & Plan:   [place] was seen today for headache.  Diagnoses and all orders for this visit:  Viral illness -     naproxen (NAPROSYN) 500 MG tablet; Take 1 tablet (500 mg total) by mouth 2 (two) times daily as needed (for pain, take with food). -     ketorolac (TORADOL) 30 MG/ML injection 30 mg  Acute intractable headache, unspecified headache type -     naproxen (NAPROSYN) 500 MG tablet; Take 1 tablet (500 mg total) by mouth 2 (two) times daily as needed (for pain, take with food). -     ketorolac (TORADOL) 30 MG/ML injection 30 mg   I am having Janice Shaw start on naproxen. I am also having her maintain her Magnesium, albuterol, Rhubarb (ESTROVERA PO), zolpidem, budesonide-formoterol, Probiotic Product (PROBIOTIC DAILY PO), vitamin C, sertraline, and omeprazole. We will continue to administer ketorolac.  Meds ordered this encounter  Medications  . naproxen (NAPROSYN) 500 MG tablet    Sig: Take 1 tablet (500 mg total) by mouth 2 (two) times daily as needed (for pain, take with food).    Dispense:  30 tablet    Refill:  0    Order Specific Question:   Supervising Provider    Answer:   Lucille Passy [[id number]]  . ketorolac (TORADOL) 30 MG/ML injection 30 mg    Follow-up: No follow-ups on file.  Wilfred Lacy, NP

## 2017-08-26 NOTE — Patient Instructions (Signed)
Call office if no improvement in 48hrs.  Viral Illness, Adult Viruses are tiny germs that can get into a person's body and cause illness. There are many different types of viruses, and they cause many types of illness. Viral illnesses can range from mild to severe. They can affect various parts of the body. Common illnesses that are caused by a virus include colds and the flu. Viral illnesses also include serious conditions such as HIV/AIDS (human immunodeficiency virus/acquired immunodeficiency syndrome). A few viruses have been linked to certain cancers. What are the causes? Many types of viruses can cause illness. Viruses invade cells in your body, multiply, and cause the infected cells to malfunction or die. When the cell dies, it releases more of the virus. When this happens, you develop symptoms of the illness, and the virus continues to spread to other cells. If the virus takes over the function of the cell, it can cause the cell to divide and grow out of control, as is the case when a virus causes cancer. Different viruses get into the body in different ways. You can get a virus by:  Swallowing food or water that is contaminated with the virus.  Breathing in droplets that have been coughed or sneezed into the air by an infected person.  Touching a surface that has been contaminated with the virus and then touching your eyes, nose, or mouth.  Being bitten by an insect or animal that carries the virus.  Having sexual contact with a person who is infected with the virus.  Being exposed to blood or fluids that contain the virus, either through an open cut or during a transfusion.  If a virus enters your body, your body's defense system (immune system) will try to fight the virus. You may be at higher risk for a viral illness if your immune system is weak. What are the signs or symptoms? Symptoms vary depending on the type of virus and the location of the cells that it invades. Common  symptoms of the main types of viral illnesses include: Cold and flu viruses  Fever.  Headache.  Sore throat.  Muscle aches.  Nasal congestion.  Cough. Digestive system (gastrointestinal) viruses  Fever.  Abdominal pain.  Nausea.  Diarrhea. Liver viruses (hepatitis)  Loss of appetite.  Tiredness.  Yellowing of the skin (jaundice). Brain and spinal cord viruses  Fever.  Headache.  Stiff neck.  Nausea and vomiting.  Confusion or sleepiness. Skin viruses  Warts.  Itching.  Rash. Sexually transmitted viruses  Discharge.  Swelling.  Redness.  Rash. How is this treated? Viruses can be difficult to treat because they live within cells. Antibiotic medicines do not treat viruses because these drugs do not get inside cells. Treatment for a viral illness may include:  Resting and drinking plenty of fluids.  Medicines to relieve symptoms. These can include over-the-counter medicine for pain and fever, medicines for cough or congestion, and medicines to relieve diarrhea.  Antiviral medicines. These drugs are available only for certain types of viruses. They may help reduce flu symptoms if taken early. There are also many antiviral medicines for hepatitis and HIV/AIDS.  Some viral illnesses can be prevented with vaccinations. A common example is the flu shot. Follow these instructions at home: Medicines   Take over-the-counter and prescription medicines only as told by your health care provider.  If you were prescribed an antiviral medicine, take it as told by your health care provider. Do not stop taking the medicine even if  you start to feel better.  Be aware of when antibiotics are needed and when they are not needed. Antibiotics do not treat viruses. If your health care provider thinks that you may have a bacterial infection as well as a viral infection, you may get an antibiotic. ? Do not ask for an antibiotic prescription if you have been diagnosed  with a viral illness. That will not make your illness go away faster. ? Frequently taking antibiotics when they are not needed can lead to antibiotic resistance. When this develops, the medicine no longer works against the bacteria that it normally fights. General instructions  Drink enough fluids to keep your urine clear or pale yellow.  Rest as much as possible.  Return to your normal activities as told by your health care provider. Ask your health care provider what activities are safe for you.  Keep all follow-up visits as told by your health care provider. This is important. How is this prevented? Take these actions to reduce your risk of viral infection:  Eat a healthy diet and get enough rest.  Wash your hands often with soap and water. This is especially important when you are in public places. If soap and water are not available, use hand sanitizer.  Avoid close contact with friends and family who have a viral illness.  If you travel to areas where viral gastrointestinal infection is common, avoid drinking water or eating raw food.  Keep your immunizations up to date. Get a flu shot every year as told by your health care provider.  Do not share toothbrushes, nail clippers, razors, or needles with other people.  Always practice safe sex.  Contact a health care provider if:  You have symptoms of a viral illness that do not go away.  Your symptoms come back after going away.  Your symptoms get worse. Get help right away if:  You have trouble breathing.  You have a severe headache or a stiff neck.  You have severe vomiting or abdominal pain. This information is not intended to replace advice given to you by your health care provider. Make sure you discuss any questions you have with your health care provider. Document Released: 09/20/2015 Document Revised: 10/23/2015 Document Reviewed: 09/20/2015 Elsevier Interactive Patient Education  [employer].

## 2017-10-05 ENCOUNTER — Telehealth: Payer: BLUE CROSS/BLUE SHIELD | Admitting: Physician Assistant

## 2017-10-05 DIAGNOSIS — N3000 Acute cystitis without hematuria: Secondary | ICD-10-CM

## 2017-10-05 MED ORDER — NITROFURANTOIN MONOHYD MACRO 100 MG PO CAPS: 100.0000 mg | ORAL_CAPSULE | Freq: Two times a day (BID) | ORAL | 0 refills | Status: AC

## 2017-10-05 NOTE — Progress Notes (Signed)

## 2017-11-08 ENCOUNTER — Encounter: Payer: Self-pay | Admitting: Internal Medicine

## 2017-11-08 ENCOUNTER — Ambulatory Visit (INDEPENDENT_AMBULATORY_CARE_PROVIDER_SITE_OTHER): Payer: BLUE CROSS/BLUE SHIELD | Admitting: Internal Medicine

## 2017-11-08 VITALS — BP 126/74 | HR 58 | Temp 98.1°F | Resp 14 | Ht 64.0 in | Wt 177.0 lb

## 2017-11-08 DIAGNOSIS — M545 Low back pain, unspecified: Secondary | ICD-10-CM

## 2017-11-08 DIAGNOSIS — E119 Type 2 diabetes mellitus without complications: Secondary | ICD-10-CM

## 2017-11-08 NOTE — Patient Instructions (Signed)
GO TO THE LAB : Get the blood work     GO TO THE FRONT DESK Schedule your next appointment for a physical exam by October 2019  Stretch daily  Hot or cold compresses as needed  Tylenol  500 mg OTC 2 tabs a day every 8 hours as needed for pain  IBUPROFEN (Advil or Motrin) 200 mg 2 tablets every 12 hours as needed for pain.  Always take it with food because may cause gastritis and ulcers.  If you notice nausea, stomach pain, change in the color of stools --->  Stop the medicine and let us know

## 2017-11-08 NOTE — Progress Notes (Signed)
Pre visit review using our clinic review tool, if applicable. No additional management support is needed unless otherwise documented below in the visit note. 

## 2017-11-08 NOTE — Progress Notes (Signed)
Subjective:    Patient ID: Janice Shaw, female    DOB: 04-Aug-1960, 57 y.o.   MRN: [id number]  DOS:  11/08/2017 Type of visit - description : ROV Interval history: DM: Not exercising as much as before,  has gained some weight. Her main concern today is back pain, this is on and off problem for years, the last episode was several days ago with moderate and persisting left back sided pain, she does have severe 10/10 episodes of sharp pain on and off.  They are short-lived. Ibuprofen usually helps.    Review of Systems No upper or lower extremity paresthesias, no injury, back pain comes on and off without apparent triggers  Past Medical History:  Diagnosis Date  . Asthma   . Diabetes ([place]) 02/14/2013  . Dyslipidemia 02/14/2013  . Elevated BP    took HCTZ  . GERD (gastroesophageal reflux disease)   . Pancreatitis 02/14/2013   Due to HCTZ ? Saw Dr Collene Mares     Past Surgical History:  Procedure Laterality Date  . PARTIAL HYSTERECTOMY  2009   no oophorectomy  . removal of VIN    . thrombosed hemorroid      Social History   Socioeconomic History  . Marital status: Married    Spouse name: Not on file  . Number of children: 0  . Years of education: Not on file  . Highest education level: Not on file  Occupational History  . Occupation: para Occupational psychologist: [employer]  . Financial resource strain: Not on file  . Food insecurity:    Worry: Not on file    Inability: Not on file  . Transportation needs:    Medical: Not on file    Non-medical: Not on file  Tobacco Use  . Smoking status: Former Smoker    Packs/day: 0.50    Years: 33.00    Pack years: 16.50    Types: Cigarettes    Last attempt to quit: 04/23/2005    Years since quitting: 12.5  . Smokeless tobacco: Never Used  Substance and Sexual Activity  . Alcohol use: Yes    Comment: socially   . Drug use: No  . Sexual activity: Not on file  Lifestyle  . Physical activity:    Days per  week: Not on file    Minutes per session: Not on file  . Stress: Not on file  Relationships  . Social connections:    Talks on phone: Not on file    Gets together: Not on file    Attends religious service: Not on file    Active member of club or organization: Not on file    Attends meetings of clubs or organizations: Not on file    Relationship status: Not on file  . Intimate partner violence:    Fear of current or ex partner: Not on file    Emotionally abused: Not on file    Physically abused: Not on file    Forced sexual activity: Not on file  Other Topics Concern  . Not on file  Social History Narrative  . Not on file      Allergies as of 11/08/2017      Reactions   Codeine Hives, Itching   Aspirin    unknown reaction, childhood allergy.   Hctz [hydrochlorothiazide]    ?pancreatitis   Erythromycin Rash   Penicillins Other (See Comments)   childhood allergy      Medication  List        Accurate as of 11/08/17 11:59 PM. Always use your most recent med list.          budesonide-formoterol 80-4.5 MCG/ACT inhaler Commonly known as:  SYMBICORT Inhale 2 puffs into the lungs 2 (two) times daily.   ESTROVERA PO Take by mouth. OTC for menopausal sx   Magnesium 500 MG Caps Take 1 capsule by mouth daily.   naproxen 500 MG tablet Commonly known as:  NAPROSYN Take 1 tablet (500 mg total) by mouth 2 (two) times daily as needed (for pain, take with food).   omeprazole 20 MG capsule Commonly known as:  PRILOSEC Take 1 capsule (20 mg total) by mouth daily.   PROAIR HFA 108 (90 Base) MCG/ACT inhaler Generic drug:  albuterol Inhale 2 puffs into the lungs every 6 (six) hours as needed for wheezing. Reported on 08/23/2015   PROBIOTIC DAILY PO Take 1 tablet by mouth daily.   sertraline 50 MG tablet Commonly known as:  ZOLOFT Take 1 tablet (50 mg total) by mouth daily.   vitamin C 500 MG tablet Commonly known as:  ASCORBIC ACID Take 500 mg by mouth daily.   zolpidem  10 MG tablet Commonly known as:  AMBIEN Take 1 tablet (10 mg total) by mouth at bedtime as needed for sleep.          Objective:   Physical Exam BP 126/74 (BP Location: Left Arm, Patient Position: Sitting, Cuff Size: Small)   Pulse (!) 58   Temp 98.1 F (36.7 C) (Oral)   Resp 14   Ht 5\' 4"  (1.626 m)   Wt 177 lb (80.3 kg)   SpO2 97%   BMI 30.38 kg/m  General:   Well developed, NAD, see BMI.  HEENT:  Normocephalic . Face symmetric, atraumatic Lungs:  CTA B Normal respiratory effort, no intercostal retractions, no accessory muscle use. Heart: RRR,  no murmur.  No pretibial edema bilaterally   MSK: No TTP at the SI joint or lumbar spine Skin: Not pale. Not jaundice Neurologic:  alert & oriented X3.  Speech normal, gait appropriate for age and unassisted.  DTRs symmetric, negative straight leg test Psych--  Cognition and judgment appear intact.  Cooperative with normal attention span and concentration.  Behavior appropriate. No anxious or depressed appearing.      Assessment & Plan:   Assessment DM a1c  6.5 (2015) Elevated BP (no hctz) Dyslipidemia Anxiety, Insomnia  GERD Cough variant Asthma, seasonal  Pancreatitis 2014, d/t HCTZ? Dr. Collene Mares  PLAN: DM: Diet controlled, check a A1c, recommend to stay as active as possible Lumbalgia: Chronic on and off, we had a long discussion about prevention with daily stretching, information provided.  Also for acute episodes judicious use of heat, cold, Tylenol and ibuprofen.  Plans to see here orthopedic doctor this week. RTC 10/19 CPX    DM: Diet control, feet exam negative, reports she has regular eye exams. Check a A1c and micro Elevated BP: BP today is normal Dyslipidemia, diet control, check labs Anxiety insomnia: Self increase Zoloft to 50 mg, feeling better. Uses Ambien rarely. RF Ambien as needed RTC 8 months

## 2017-11-09 ENCOUNTER — Other Ambulatory Visit (INDEPENDENT_AMBULATORY_CARE_PROVIDER_SITE_OTHER): Payer: BLUE CROSS/BLUE SHIELD

## 2017-11-09 DIAGNOSIS — E119 Type 2 diabetes mellitus without complications: Secondary | ICD-10-CM | POA: Diagnosis not present

## 2017-11-09 LAB — HEMOGLOBIN A1C: Hgb A1c MFr Bld: 6.4 % (ref 4.6–6.5)

## 2017-11-09 NOTE — Assessment & Plan Note (Signed)
DM: Diet controlled, check a A1c, recommend to stay as active as possible Lumbalgia: Chronic on and off, we had a long discussion about prevention with daily stretching, information provided.  Also for acute episodes judicious use of heat, cold, Tylenol and ibuprofen.  Plans to see here orthopedic doctor this week. RTC 10/19 CPX

## 2017-11-11 DIAGNOSIS — M545 Low back pain: Secondary | ICD-10-CM | POA: Diagnosis not present

## 2017-11-17 DIAGNOSIS — M545 Low back pain: Secondary | ICD-10-CM | POA: Diagnosis not present

## 2017-11-23 DIAGNOSIS — M545 Low back pain: Secondary | ICD-10-CM | POA: Diagnosis not present

## 2018-01-04 DIAGNOSIS — H6121 Impacted cerumen, right ear: Secondary | ICD-10-CM | POA: Diagnosis not present

## 2018-03-11 ENCOUNTER — Encounter: Payer: Self-pay | Admitting: Internal Medicine

## 2018-03-11 ENCOUNTER — Ambulatory Visit (INDEPENDENT_AMBULATORY_CARE_PROVIDER_SITE_OTHER): Payer: BLUE CROSS/BLUE SHIELD | Admitting: Internal Medicine

## 2018-03-11 VITALS — BP 114/70 | HR 57 | Temp 98.0°F | Resp 16 | Ht 64.0 in | Wt 170.1 lb

## 2018-03-11 DIAGNOSIS — E119 Type 2 diabetes mellitus without complications: Secondary | ICD-10-CM

## 2018-03-11 DIAGNOSIS — Z0000 Encounter for general adult medical examination without abnormal findings: Secondary | ICD-10-CM | POA: Diagnosis not present

## 2018-03-11 DIAGNOSIS — E785 Hyperlipidemia, unspecified: Secondary | ICD-10-CM | POA: Diagnosis not present

## 2018-03-11 LAB — COMPREHENSIVE METABOLIC PANEL
ALT: 6 U/L (ref 0–35)
AST: 12 U/L (ref 0–37)
Albumin: 4.6 g/dL (ref 3.5–5.2)
Alkaline Phosphatase: 50 U/L (ref 39–117)
BUN: 19 mg/dL (ref 6–23)
CHLORIDE: 102 meq/L (ref 96–112)
CO2: 29 meq/L (ref 19–32)
CREATININE: 0.73 mg/dL (ref 0.40–1.20)
Calcium: 9.8 mg/dL (ref 8.4–10.5)
GFR: 86.59 mL/min (ref >60.00)
GLUCOSE: 125 mg/dL — AB (ref 70–99)
Potassium: 5 mEq/L (ref 3.5–5.1)
Sodium: 139 mEq/L (ref 135–145)
Total Bilirubin: 0.5 mg/dL (ref 0.2–1.2)
Total Protein: 6.8 g/dL (ref 6.0–8.3)

## 2018-03-11 LAB — LIPID PANEL
CHOL/HDL RATIO: 3
Cholesterol: 225 mg/dL — ABNORMAL HIGH (ref 0–200)
HDL: 69.6 mg/dL (ref >39.00)
LDL CALC: 143 mg/dL — AB (ref 0–99)
NonHDL: 155.68
TRIGLYCERIDES: 65 mg/dL (ref 0.0–149.0)
VLDL: 13 mg/dL (ref 0.0–40.0)

## 2018-03-11 LAB — MICROALBUMIN / CREATININE URINE RATIO
Creatinine,U: 95.6 mg/dL
Microalb Creat Ratio: 0.7 mg/g (ref 0.0–30.0)

## 2018-03-11 LAB — HEMOGLOBIN A1C: Hgb A1c MFr Bld: 6.3 % (ref 4.6–6.5)

## 2018-03-11 NOTE — Progress Notes (Signed)
Subjective:    Patient ID: Janice Shaw, female    DOB: [birth year], 57 y.o.   MRN: [id number]  DOS:  03/11/2018 Type of visit - description : CPX Interval history:  Feels well    Review of Systems  A 14 point review of systems is negative    Past Medical History:  Diagnosis Date  . Asthma   . Diabetes ([place]) 02/14/2013  . Dyslipidemia 02/14/2013  . Elevated BP    took HCTZ  . GERD (gastroesophageal reflux disease)   . Pancreatitis 02/14/2013   Due to HCTZ ? Saw Dr Collene Mares     Past Surgical History:  Procedure Laterality Date  . PARTIAL HYSTERECTOMY  2009   no oophorectomy  . removal of VIN    . thrombosed hemorroid      Social History   Socioeconomic History  . Marital status: Married    Spouse name: Not on file  . Number of children: 0  . Years of education: Not on file  . Highest education level: Not on file  Occupational History  . Occupation: para Occupational psychologist: [employer]  . Financial resource strain: Not on file  . Food insecurity:    Worry: Not on file    Inability: Not on file  . Transportation needs:    Medical: Not on file    Non-medical: Not on file  Tobacco Use  . Smoking status: Former Smoker    Packs/day: 0.50    Years: 33.00    Pack years: 16.50    Types: Cigarettes    Last attempt to quit: 04/23/2005    Years since quitting: 12.8  . Smokeless tobacco: Never Used  Substance and Sexual Activity  . Alcohol use: Yes    Comment: socially   . Drug use: No  . Sexual activity: Not on file  Lifestyle  . Physical activity:    Days per week: Not on file    Minutes per session: Not on file  . Stress: Not on file  Relationships  . Social connections:    Talks on phone: Not on file    Gets together: Not on file    Attends religious service: Not on file    Active member of club or organization: Not on file    Attends meetings of clubs or organizations: Not on file    Relationship status: Not on file  .  Intimate partner violence:    Fear of current or ex partner: Not on file    Emotionally abused: Not on file    Physically abused: Not on file    Forced sexual activity: Not on file  Other Topics Concern  . Not on file  Social History Narrative  . Not on file     Family History  Problem Relation Age of Onset  . Cancer Mother        non Hodgkins lymphoma  . Heart disease Mother        CHF (due to chemo), no MI  . Colon cancer Neg Hx   . Breast cancer Neg Hx      Allergies as of 03/11/2018      Reactions   Codeine Hives, Itching   Aspirin    unknown reaction, childhood allergy.   Hctz [hydrochlorothiazide]    ?pancreatitis   Erythromycin Rash   Penicillins Other (See Comments)   childhood allergy      Medication List  Accurate as of 03/11/18 11:59 PM. Always use your most recent med list.          budesonide-formoterol 80-4.5 MCG/ACT inhaler Commonly known as:  SYMBICORT Inhale 2 puffs into the lungs 2 (two) times daily.   ESTROVERA PO Take by mouth. OTC for menopausal sx   Magnesium 500 MG Caps Take 1 capsule by mouth daily.   naproxen 500 MG tablet Commonly known as:  NAPROSYN Take 1 tablet (500 mg total) by mouth 2 (two) times daily as needed (for pain, take with food).   omeprazole 20 MG capsule Commonly known as:  PRILOSEC Take 1 capsule (20 mg total) by mouth daily.   PROAIR HFA 108 (90 Base) MCG/ACT inhaler Generic drug:  albuterol Inhale 2 puffs into the lungs every 6 (six) hours as needed for wheezing. Reported on 08/23/2015   PROBIOTIC DAILY PO Take 1 tablet by mouth daily.   sertraline 50 MG tablet Commonly known as:  ZOLOFT Take 1 tablet (50 mg total) by mouth daily.   vitamin C 500 MG tablet Commonly known as:  ASCORBIC ACID Take 500 mg by mouth daily.   zolpidem 10 MG tablet Commonly known as:  AMBIEN Take 1 tablet (10 mg total) by mouth at bedtime as needed for sleep.          Objective:   Physical Exam BP 114/70  (BP Location: Left Arm, Patient Position: Sitting, Cuff Size: Small)   Pulse (!) 57   Temp 98 F (36.7 C) (Oral)   Resp 16   Ht 5\' 4"  (1.626 m)   Wt 170 lb 2 oz (77.2 kg)   SpO2 97%   BMI 29.20 kg/m  General: Well developed, NAD, see BMI.  Neck: No  thyromegaly  HEENT:  Normocephalic . Face symmetric, atraumatic Lungs:  CTA B Normal respiratory effort, no intercostal retractions, no accessory muscle use. Heart: RRR,  no murmur.  No pretibial edema bilaterally  Abdomen:  Not distended, soft, non-tender. No rebound or rigidity.   Skin: Exposed areas without rash. Not pale. Not jaundice Neurologic:  alert & oriented X3.  Speech normal, gait appropriate for age and unassisted Strength symmetric and appropriate for age.  Psych: Cognition and judgment appear intact.  Cooperative with normal attention span and concentration.  Behavior appropriate. No anxious or depressed appearing.     Assessment & Plan:   Assessment DM a1c  7.1 (2014) Elevated BP   Dyslipidemia Anxiety, Insomnia  GERD Cough variant Asthma, seasonal  Pancreatitis 2014, d/t HCTZ? Dr. Collene Mares  PLAN: DM: Diet controlled, checking labs. Dyslipidemia: Diet controlled, checking labs Anxiety, insomnia:  well controlled, hardly ever takes Ambien RTC 6 to 8 months

## 2018-03-11 NOTE — Progress Notes (Signed)
Pre visit review using our clinic review tool, if applicable. No additional management support is needed unless otherwise documented below in the visit note. 

## 2018-03-11 NOTE — Patient Instructions (Signed)
GO TO THE LAB : Get the blood work     GO TO THE FRONT DESK Schedule your next appointment for a  Check up in 6-8 months   See the eye doctor regulalrly

## 2018-03-11 NOTE — Assessment & Plan Note (Addendum)
-  Tdap 2015; PNA 23 2016, prevnar 2017; shingrix discussed; had a flu shot already -Female care: s/p hyst, still has ovaries, no recent gyn visit, rec to set up, declined referral , [facility] 03/2017 -[facility]--patient reported 2009- polyps removed, cscope again 07/2017 next per GI;  Dr Collene Mares -Diet-exercise: Doing well, trying to stay active.  Recommend a multivitamin or a vitamin D daily - Labs CMP, FLP, A1c, micro

## 2018-03-12 NOTE — Assessment & Plan Note (Signed)
DM: Diet controlled, checking labs. Dyslipidemia: Diet controlled, checking labs Anxiety, insomnia:  well controlled, hardly ever takes Ambien RTC 6 to 8 months

## 2018-03-14 MED ORDER — ATORVASTATIN CALCIUM 10 MG PO TABS: 10.0000 mg | ORAL_TABLET | Freq: Every day | ORAL | 3 refills | Status: DC

## 2018-03-14 NOTE — Addendum Note (Signed)
Addended byDamita Dunnings D on: 03/14/2018 02:45 PM   Modules accepted: Orders

## 2018-03-22 DIAGNOSIS — D225 Melanocytic nevi of trunk: Secondary | ICD-10-CM | POA: Diagnosis not present

## 2018-03-22 DIAGNOSIS — L814 Other melanin hyperpigmentation: Secondary | ICD-10-CM | POA: Diagnosis not present

## 2018-03-22 DIAGNOSIS — D1801 Hemangioma of skin and subcutaneous tissue: Secondary | ICD-10-CM | POA: Diagnosis not present

## 2018-03-22 DIAGNOSIS — L821 Other seborrheic keratosis: Secondary | ICD-10-CM | POA: Diagnosis not present

## 2018-04-02 ENCOUNTER — Other Ambulatory Visit: Payer: Self-pay | Admitting: Internal Medicine

## 2018-05-23 ENCOUNTER — Other Ambulatory Visit: Payer: BLUE CROSS/BLUE SHIELD

## 2018-05-31 ENCOUNTER — Other Ambulatory Visit: Payer: Self-pay | Admitting: Internal Medicine

## 2018-06-06 ENCOUNTER — Other Ambulatory Visit (INDEPENDENT_AMBULATORY_CARE_PROVIDER_SITE_OTHER): Payer: BLUE CROSS/BLUE SHIELD

## 2018-06-06 DIAGNOSIS — E785 Hyperlipidemia, unspecified: Secondary | ICD-10-CM

## 2018-06-06 LAB — LIPID PANEL
CHOL/HDL RATIO: 3
Cholesterol: 194 mg/dL (ref 0–200)
HDL: 71.7 mg/dL (ref >39.00)
LDL Cholesterol: 104 mg/dL — ABNORMAL HIGH (ref 0–99)
NONHDL: 122.6
Triglycerides: 92 mg/dL (ref 0.0–149.0)
VLDL: 18.4 mg/dL (ref 0.0–40.0)

## 2018-06-06 LAB — AST: AST: 15 U/L (ref 0–37)

## 2018-06-06 LAB — ALT: ALT: 7 U/L (ref 0–35)

## 2018-06-08 MED ORDER — ATORVASTATIN CALCIUM 10 MG PO TABS: 10.0000 mg | ORAL_TABLET | Freq: Every day | ORAL | 6 refills | Status: DC

## 2018-06-08 NOTE — Addendum Note (Signed)
Addended byDamita Dunnings D on: 06/08/2018 01:13 PM   Modules accepted: Orders

## 2018-08-15 DIAGNOSIS — M545 Low back pain: Secondary | ICD-10-CM | POA: Diagnosis not present

## 2018-10-07 ENCOUNTER — Other Ambulatory Visit: Payer: Self-pay | Admitting: Internal Medicine

## 2018-11-10 ENCOUNTER — Ambulatory Visit: Payer: BLUE CROSS/BLUE SHIELD | Admitting: Internal Medicine

## 2018-12-19 ENCOUNTER — Encounter: Payer: Self-pay | Admitting: Internal Medicine

## 2019-04-03 ENCOUNTER — Other Ambulatory Visit: Payer: Self-pay | Admitting: Internal Medicine

## 2019-05-09 ENCOUNTER — Other Ambulatory Visit: Payer: Self-pay | Admitting: Internal Medicine

## 2019-05-10 MED ORDER — SERTRALINE HCL 50 MG PO TABS: 50.0000 mg | ORAL_TABLET | Freq: Every day | ORAL | 0 refills | Status: DC

## 2019-05-11 ENCOUNTER — Other Ambulatory Visit: Payer: Self-pay

## 2019-05-12 ENCOUNTER — Ambulatory Visit (INDEPENDENT_AMBULATORY_CARE_PROVIDER_SITE_OTHER): Payer: Self-pay | Admitting: Internal Medicine

## 2019-05-12 DIAGNOSIS — F419 Anxiety disorder, unspecified: Secondary | ICD-10-CM

## 2019-05-12 DIAGNOSIS — E785 Hyperlipidemia, unspecified: Secondary | ICD-10-CM

## 2019-05-12 DIAGNOSIS — K219 Gastro-esophageal reflux disease without esophagitis: Secondary | ICD-10-CM

## 2019-05-12 DIAGNOSIS — E119 Type 2 diabetes mellitus without complications: Secondary | ICD-10-CM

## 2019-05-12 MED ORDER — OMEPRAZOLE 20 MG PO CPDR: 20.0000 mg | DELAYED_RELEASE_CAPSULE | Freq: Every day | ORAL | 1 refills | Status: DC

## 2019-05-12 MED ORDER — SERTRALINE HCL 50 MG PO TABS: 50.0000 mg | ORAL_TABLET | Freq: Every day | ORAL | 1 refills | Status: DC

## 2019-05-12 NOTE — Progress Notes (Signed)
Subjective:    Patient ID: Janice Shaw, female    DOB: 08-29-1960, 58 y.o.   MRN: [id number]  DOS:  05/12/2019 Type of visit - description: Virtual Visit via Video Note  I connected with the above patient  by a video enabled telemedicine application and verified that I am speaking with the correct person using two identifiers.   THIS ENCOUNTER IS A VIRTUAL VISIT DUE TO COVID-19 - PATIENT WAS NOT SEEN IN THE OFFICE. PATIENT HAS CONSENTED TO VIRTUAL VISIT / TELEMEDICINE VISIT   Location of patient: home  Location of provider: office  I discussed the limitations of evaluation and management by telemedicine and the availability of in person appointments. The patient expressed understanding and agreed to proceed.  History of Present Illness: Follow-up, med refills Anxiety depression: Obviously distressed about COVID-19 and the pandemia but doing okay.  Needs RFs GERD: Needs a refill High cholesterol: Did well with Lipitor for a while but then she forgot taking it eventually stopped it.  No side effects that she can recall Asthma: Not using inhalers DM: Not doing well with diet, has gained approximately 12 pounds     Review of Systems Denies fever chills No chest pain no difficulty breathing No nausea, vomiting, diarrhea Past Medical History:  Diagnosis Date  . Asthma   . Diabetes ([place]) 02/14/2013  . Dyslipidemia 02/14/2013  . Elevated BP    took HCTZ  . GERD (gastroesophageal reflux disease)   . Pancreatitis 02/14/2013   Due to HCTZ ? Saw Dr Collene Mares     Past Surgical History:  Procedure Laterality Date  . PARTIAL HYSTERECTOMY  2009   no oophorectomy  . removal of VIN    . thrombosed hemorroid      Social History   Socioeconomic History  . Marital status: Married    Spouse name: Not on file  . Number of children: 0  . Years of education: Not on file  . Highest education level: Not on file  Occupational History  . Occupation: para Occupational psychologist: [employer]  Tobacco Use  . Smoking status: Former Smoker    Packs/day: 0.50    Years: 33.00    Pack years: 16.50    Types: Cigarettes    Quit date: 04/23/2005    Years since quitting: 14.0  . Smokeless tobacco: Never Used  Substance and Sexual Activity  . Alcohol use: Yes    Comment: socially   . Drug use: No  . Sexual activity: Not on file  Other Topics Concern  . Not on file  Social History Narrative  . Not on file   Social Determinants of Health   Financial Resource Strain:   . Difficulty of Paying Living Expenses: Not on file  Food Insecurity:   . Worried About Charity fundraiser in the Last Year: Not on file  . Ran Out of Food in the Last Year: Not on file  Transportation Needs:   . Lack of Transportation (Medical): Not on file  . Lack of Transportation (Non-Medical): Not on file  Physical Activity:   . Days of Exercise per Week: Not on file  . Minutes of Exercise per Session: Not on file  Stress:   . Feeling of Stress : Not on file  Social Connections:   . Frequency of Communication with Friends and Family: Not on file  . Frequency of Social Gatherings with Friends and Family: Not on file  . Attends Religious Services: Not  on file  . Active Member of Clubs or Organizations: Not on file  . Attends Archivist Meetings: Not on file  . Marital Status: Not on file  Intimate Partner Violence:   . Fear of Current or Ex-Partner: Not on file  . Emotionally Abused: Not on file  . Physically Abused: Not on file  . Sexually Abused: Not on file      Allergies as of 05/12/2019      Reactions   Codeine Hives, Itching   Aspirin    unknown reaction, childhood allergy.   Hctz [hydrochlorothiazide]    ?pancreatitis   Erythromycin Rash   Penicillins Other (See Comments)   childhood allergy      Medication List       Accurate as of May 12, 2019  3:02 PM. If you have any questions, ask your nurse or doctor.        atorvastatin 10 MG tablet Commonly  known as: LIPITOR Take 1 tablet (10 mg total) by mouth at bedtime.   budesonide-formoterol 80-4.5 MCG/ACT inhaler Commonly known as: Symbicort Inhale 2 puffs into the lungs 2 (two) times daily.   ESTROVERA PO Take by mouth. OTC for menopausal sx   Magnesium 500 MG Caps Take 1 capsule by mouth daily.   naproxen 500 MG tablet Commonly known as: NAPROSYN Take 1 tablet (500 mg total) by mouth 2 (two) times daily as needed (for pain, take with food).   omeprazole 20 MG capsule Commonly known as: PRILOSEC Take 1 capsule (20 mg total) by mouth daily.   ProAir HFA 108 (90 Base) MCG/ACT inhaler Generic drug: albuterol Inhale 2 puffs into the lungs every 6 (six) hours as needed for wheezing. Reported on 08/23/2015   PROBIOTIC DAILY PO Take 1 tablet by mouth daily.   sertraline 50 MG tablet Commonly known as: ZOLOFT Take 1 tablet (50 mg total) by mouth daily.   vitamin C 500 MG tablet Commonly known as: ASCORBIC ACID Take 500 mg by mouth daily.   zolpidem 10 MG tablet Commonly known as: AMBIEN Take 1 tablet (10 mg total) by mouth at bedtime as needed for sleep.           Objective:   Physical Exam There were no vitals taken for this visit. This is a virtual video visit, she is alert oriented x3, in no apparent distress.  She looks well and healthy.    Assessment     Assessment DM a1c  7.1 (2014) Elevated BP   Dyslipidemia Anxiety, Insomnia  GERD Cough variant Asthma, seasonal  Pancreatitis 2014, d/t HCTZ? Dr. Collene Mares  PLAN: DM: Diet controlled, not doing well with lifestyle, has gained weight.  Will check A1c, CMP Dyslipidemia: Took Lipitor for a while, it worked well for her, she eventually stop it because she kept forgetting.  We will check a FLP, likes to know her baseline and possibly go back on medication. Anxiety, insomnia: Rarely uses Ambien, has still some at home.  Refill Zoloft GERD: Controlled, refill PPIs.  Check CBC Cough variant asthma: Hardly ever  uses inhalers, had a flu shot. RTC for labs next week RTC for a physical exam in 3 to 4 months.    I discussed the assessment and treatment plan with the patient. The patient was provided an opportunity to ask questions and all were answered. The patient agreed with the plan and demonstrated an understanding of the instructions.   The patient was advised to call back or seek an in-person evaluation  if the symptoms worsen or if the condition fails to improve as anticipated.

## 2019-05-13 NOTE — Assessment & Plan Note (Signed)
DM: Diet controlled, not doing well with lifestyle, has gained weight.  Will check A1c, CMP Dyslipidemia: Took Lipitor for a while, it worked well for her, she eventually stop it because she kept forgetting.  We will check a FLP, likes to know her baseline and possibly go back on medication. Anxiety, insomnia: Rarely uses Ambien, has still some at home.  Refill Zoloft GERD: Controlled, refill PPIs.  Check CBC Cough variant asthma: Hardly ever uses inhalers, had a flu shot. RTC for labs next week RTC for a physical exam in 3 to 4 months.

## 2019-05-15 ENCOUNTER — Telehealth: Payer: Self-pay | Admitting: Internal Medicine

## 2019-05-15 NOTE — Telephone Encounter (Signed)
-----   Message from Colon Branch, MD sent at 05/12/2019  3:26 PM EST ----- Regarding: schedule Needs labs to be done next week fastingSchedule a physical in 3 to 4 months from today

## 2019-05-15 NOTE — Telephone Encounter (Signed)
Called pt left detailed message.

## 2019-06-14 ENCOUNTER — Telehealth: Payer: Self-pay | Admitting: *Deleted

## 2019-06-14 NOTE — Telephone Encounter (Signed)
Called pt for screening questions prior to lab appt tomorrow and voicemail was full. Sent mychart message

## 2019-06-15 ENCOUNTER — Other Ambulatory Visit: Payer: Self-pay

## 2019-06-15 ENCOUNTER — Other Ambulatory Visit (INDEPENDENT_AMBULATORY_CARE_PROVIDER_SITE_OTHER): Payer: BLUE CROSS/BLUE SHIELD

## 2019-06-15 DIAGNOSIS — E119 Type 2 diabetes mellitus without complications: Secondary | ICD-10-CM | POA: Diagnosis not present

## 2019-06-15 DIAGNOSIS — K219 Gastro-esophageal reflux disease without esophagitis: Secondary | ICD-10-CM

## 2019-06-15 DIAGNOSIS — E785 Hyperlipidemia, unspecified: Secondary | ICD-10-CM

## 2019-06-15 LAB — COMPREHENSIVE METABOLIC PANEL
ALT: 9 U/L (ref 0–35)
AST: 17 U/L (ref 0–37)
Albumin: 4.5 g/dL (ref 3.5–5.2)
Alkaline Phosphatase: 67 U/L (ref 39–117)
BUN: 18 mg/dL (ref 6–23)
CO2: 26 mEq/L (ref 19–32)
Calcium: 9.5 mg/dL (ref 8.4–10.5)
Chloride: 104 mEq/L (ref 96–112)
Creatinine, Ser: 0.79 mg/dL (ref 0.40–1.20)
GFR: 74.05 mL/min (ref >60.00)
Glucose, Bld: 133 mg/dL — ABNORMAL HIGH (ref 70–99)
Potassium: 4.1 mEq/L (ref 3.5–5.1)
Sodium: 139 mEq/L (ref 135–145)
Total Bilirubin: 0.4 mg/dL (ref 0.2–1.2)
Total Protein: 6.8 g/dL (ref 6.0–8.3)

## 2019-06-15 LAB — LIPID PANEL
Cholesterol: 234 mg/dL — ABNORMAL HIGH (ref 0–200)
HDL: 73.2 mg/dL (ref >39.00)
LDL Cholesterol: 144 mg/dL — ABNORMAL HIGH (ref 0–99)
NonHDL: 160.56
Total CHOL/HDL Ratio: 3
Triglycerides: 85 mg/dL (ref 0.0–149.0)
VLDL: 17 mg/dL (ref 0.0–40.0)

## 2019-06-15 LAB — CBC WITH DIFFERENTIAL/PLATELET
Basophils Absolute: 0 10*3/uL (ref 0.0–0.1)
Basophils Relative: 0.9 % (ref 0.0–3.0)
Eosinophils Absolute: 0.1 10*3/uL (ref 0.0–0.7)
Eosinophils Relative: 1.8 % (ref 0.0–5.0)
HCT: 43.7 % (ref 36.0–46.0)
Hemoglobin: 14.5 g/dL (ref 12.0–15.0)
Lymphocytes Relative: 35.2 % (ref 12.0–46.0)
Lymphs Abs: 1.7 10*3/uL (ref 0.7–4.0)
MCHC: 33.2 g/dL (ref 30.0–36.0)
MCV: 87.5 fl (ref 78.0–100.0)
Monocytes Absolute: 0.3 10*3/uL (ref 0.1–1.0)
Monocytes Relative: 7.3 % (ref 3.0–12.0)
Neutro Abs: 2.6 10*3/uL (ref 1.4–7.7)
Neutrophils Relative %: 54.8 % (ref 43.0–77.0)
Platelets: 202 10*3/uL (ref 150.0–400.0)
RBC: 5 Mil/uL (ref 3.87–5.11)
RDW: 13.3 % (ref 11.5–15.5)
WBC: 4.8 10*3/uL (ref 4.0–10.5)

## 2019-06-15 LAB — HEMOGLOBIN A1C: Hgb A1c MFr Bld: 6.5 % (ref 4.6–6.5)

## 2019-06-19 MED ORDER — ATORVASTATIN CALCIUM 10 MG PO TABS: 10.0000 mg | ORAL_TABLET | Freq: Every day | ORAL | 1 refills | Status: DC

## 2019-06-19 NOTE — Addendum Note (Signed)
Addended byDamita Dunnings D on: 06/19/2019 07:48 AM   Modules accepted: Orders

## 2019-06-28 DIAGNOSIS — M25562 Pain in left knee: Secondary | ICD-10-CM | POA: Diagnosis not present

## 2019-07-02 ENCOUNTER — Encounter: Payer: Self-pay | Admitting: Internal Medicine

## 2019-07-07 DIAGNOSIS — M25562 Pain in left knee: Secondary | ICD-10-CM | POA: Diagnosis not present

## 2019-07-12 DIAGNOSIS — M25562 Pain in left knee: Secondary | ICD-10-CM | POA: Diagnosis not present

## 2019-07-31 DIAGNOSIS — M23222 Derangement of posterior horn of medial meniscus due to old tear or injury, left knee: Secondary | ICD-10-CM | POA: Diagnosis not present

## 2019-07-31 DIAGNOSIS — M2342 Loose body in knee, left knee: Secondary | ICD-10-CM | POA: Diagnosis not present

## 2019-07-31 DIAGNOSIS — S83272A Complex tear of lateral meniscus, current injury, left knee, initial encounter: Secondary | ICD-10-CM | POA: Diagnosis not present

## 2019-07-31 DIAGNOSIS — M94262 Chondromalacia, left knee: Secondary | ICD-10-CM | POA: Diagnosis not present

## 2019-07-31 DIAGNOSIS — Y999 Unspecified external cause status: Secondary | ICD-10-CM | POA: Diagnosis not present

## 2019-07-31 DIAGNOSIS — M1712 Unilateral primary osteoarthritis, left knee: Secondary | ICD-10-CM | POA: Diagnosis not present

## 2019-07-31 DIAGNOSIS — S83232A Complex tear of medial meniscus, current injury, left knee, initial encounter: Secondary | ICD-10-CM | POA: Diagnosis not present

## 2019-07-31 DIAGNOSIS — S83282A Other tear of lateral meniscus, current injury, left knee, initial encounter: Secondary | ICD-10-CM | POA: Diagnosis not present

## 2019-07-31 DIAGNOSIS — X58XXXA Exposure to other specified factors, initial encounter: Secondary | ICD-10-CM | POA: Diagnosis not present

## 2019-07-31 HISTORY — PX: KNEE ARTHROSCOPY: SUR90

## 2019-08-07 ENCOUNTER — Encounter: Payer: Self-pay | Admitting: Internal Medicine

## 2019-08-07 ENCOUNTER — Other Ambulatory Visit: Payer: Self-pay

## 2019-08-07 ENCOUNTER — Ambulatory Visit (INDEPENDENT_AMBULATORY_CARE_PROVIDER_SITE_OTHER): Payer: BLUE CROSS/BLUE SHIELD | Admitting: Internal Medicine

## 2019-08-07 DIAGNOSIS — B029 Zoster without complications: Secondary | ICD-10-CM

## 2019-08-07 DIAGNOSIS — R21 Rash and other nonspecific skin eruption: Secondary | ICD-10-CM | POA: Diagnosis not present

## 2019-08-07 MED ORDER — VALACYCLOVIR HCL 1 G PO TABS: 1000.0000 mg | ORAL_TABLET | Freq: Three times a day (TID) | ORAL | 0 refills | Status: DC

## 2019-08-07 NOTE — Progress Notes (Signed)
Subjective:    Patient ID: Baruch Merl, female    DOB: 1961/04/04, 59 y.o.   MRN: [id number]  DOS:  08/07/2019 Type of visit - description: Virtual Visit via Telephone  Attempted  to make this a video visit, due to technical difficulties from the patient side it was not possible  thus we proceeded with a Virtual Visit via Telephone    I connected with above mentioned patient  by telephone and verified that I am speaking with the correct person using two identifiers.  THIS ENCOUNTER IS A VIRTUAL VISIT DUE TO COVID-19 - PATIENT WAS NOT SEEN IN THE OFFICE. PATIENT HAS CONSENTED TO VIRTUAL VISIT / TELEMEDICINE VISIT   Location of patient: home  Location of provider: office  I discussed the limitations, risks, security and privacy concerns of performing an evaluation and management service by telephone and the availability of in person appointments. I also discussed with the patient that there may be a patient responsible charge related to this service. The patient expressed understanding and agreed to proceed.  Acute The patient had a knee procedure 07/31/2019.  The procedure went well. On 08/02/2019 he noted a couple of pimples of the forehead, they are itchy and at times painful. Developed some additional lesions at the scalp at the left neck near the ear. There is no lesions at the right side of the head or neck. She wonders about shingles. She took an antibiotic at the time of the surgery but otherwise no new medicines.      Review of Systems No fever chills No headaches per se Eye is not involved No difficulty hearing No visual disturbances. Scalp itself is not tender is just the pimples that hurt  Past Medical History:  Diagnosis Date  . Asthma   . Diabetes ([place]) 02/14/2013  . Dyslipidemia 02/14/2013  . Elevated BP    took HCTZ  . GERD (gastroesophageal reflux disease)   . Pancreatitis 02/14/2013   Due to HCTZ ? Saw Dr Collene Mares     Past Surgical History:  Procedure  Laterality Date  . PARTIAL HYSTERECTOMY  2009   no oophorectomy  . removal of VIN    . thrombosed hemorroid      Allergies as of 08/07/2019      Reactions   Codeine Hives, Itching   Aspirin    unknown reaction, childhood allergy.   Hctz [hydrochlorothiazide]    ?pancreatitis   Erythromycin Rash   Penicillins Other (See Comments)   childhood allergy      Medication List       Accurate as of August 07, 2019  5:34 PM. If you have any questions, ask your nurse or doctor.        atorvastatin 10 MG tablet Commonly known as: LIPITOR Take 1 tablet (10 mg total) by mouth at bedtime.   budesonide-formoterol 80-4.5 MCG/ACT inhaler Commonly known as: Symbicort Inhale 2 puffs into the lungs 2 (two) times daily.   ESTROVERA PO Take by mouth. OTC for menopausal sx   Magnesium 500 MG Caps Take 1 capsule by mouth daily.   naproxen 500 MG tablet Commonly known as: NAPROSYN Take 1 tablet (500 mg total) by mouth 2 (two) times daily as needed (for pain, take with food).   omeprazole 20 MG capsule Commonly known as: PRILOSEC Take 1 capsule (20 mg total) by mouth daily.   ProAir HFA 108 (90 Base) MCG/ACT inhaler Generic drug: albuterol Inhale 2 puffs into the lungs every 6 (six) hours as  needed for wheezing. Reported on 08/23/2015   PROBIOTIC DAILY PO Take 1 tablet by mouth daily.   sertraline 50 MG tablet Commonly known as: ZOLOFT Take 1 tablet (50 mg total) by mouth daily.   vitamin C 500 MG tablet Commonly known as: ASCORBIC ACID Take 500 mg by mouth daily.   zolpidem 10 MG tablet Commonly known as: AMBIEN Take 1 tablet (10 mg total) by mouth at bedtime as needed for sleep.          Objective:   Physical Exam There were no vitals taken for this visit. The patient is alert oriented x3, in no apparent distress.  No vital signs obtained by the patient today.        Assessment      Assessment DM a1c  7.1 (2014) Elevated BP   Dyslipidemia Anxiety, Insomnia   GERD Cough variant Asthma, seasonal  Pancreatitis 2014, d/t HCTZ? Dr. Collene Mares  PLAN: Shingles Painful rash at the left side of the head and neck most likely shingles.  She knows the limitations of a telephone/picture evaluation. We agreed to treat as shingles with Valtrex. Anticipate rash will start improving. Watch for eye or ear involvement, if that is the case she needs to let me know. Watch for persistent pain once the rash is over    I discussed the assessment and treatment plan with the patient. The patient was provided an opportunity to ask questions and all were answered. The patient agreed with the plan and demonstrated an understanding of the instructions.   The patient was advised to call back or seek an in-person evaluation if the symptoms worsen or if the condition fails to improve as anticipated.  I provided 15  minutes of non-face-to-face time during this encounter.  Kathlene November, MD

## 2019-08-09 DIAGNOSIS — B029 Zoster without complications: Secondary | ICD-10-CM | POA: Insufficient documentation

## 2019-08-09 NOTE — Assessment & Plan Note (Signed)
Shingles Painful rash at the left side of the head and neck most likely shingles.  She knows the limitations of a telephone/picture evaluation. We agreed to treat as shingles with Valtrex. Anticipate rash will start improving. Watch for eye or ear involvement, if that is the case she needs to let me know. Watch for persistent pain once the rash is over

## 2019-08-10 ENCOUNTER — Encounter: Payer: Self-pay | Admitting: Internal Medicine

## 2019-08-16 DIAGNOSIS — M25562 Pain in left knee: Secondary | ICD-10-CM | POA: Diagnosis not present

## 2019-08-22 DIAGNOSIS — M25562 Pain in left knee: Secondary | ICD-10-CM | POA: Diagnosis not present

## 2019-08-24 DIAGNOSIS — M25562 Pain in left knee: Secondary | ICD-10-CM | POA: Diagnosis not present

## 2019-08-27 ENCOUNTER — Encounter: Payer: Self-pay | Admitting: Internal Medicine

## 2019-08-28 DIAGNOSIS — M25562 Pain in left knee: Secondary | ICD-10-CM | POA: Diagnosis not present

## 2019-09-04 DIAGNOSIS — M25562 Pain in left knee: Secondary | ICD-10-CM | POA: Diagnosis not present

## 2019-09-11 DIAGNOSIS — M1712 Unilateral primary osteoarthritis, left knee: Secondary | ICD-10-CM | POA: Diagnosis not present

## 2019-09-12 DIAGNOSIS — M25562 Pain in left knee: Secondary | ICD-10-CM | POA: Diagnosis not present

## 2019-09-14 DIAGNOSIS — M25562 Pain in left knee: Secondary | ICD-10-CM | POA: Diagnosis not present

## 2019-09-15 ENCOUNTER — Encounter: Payer: Self-pay | Admitting: Internal Medicine

## 2019-09-25 DIAGNOSIS — M25562 Pain in left knee: Secondary | ICD-10-CM | POA: Diagnosis not present

## 2019-09-28 DIAGNOSIS — M25562 Pain in left knee: Secondary | ICD-10-CM | POA: Diagnosis not present

## 2019-10-02 DIAGNOSIS — M25562 Pain in left knee: Secondary | ICD-10-CM | POA: Diagnosis not present

## 2019-10-04 ENCOUNTER — Encounter: Payer: Self-pay | Admitting: Internal Medicine

## 2019-10-09 DIAGNOSIS — M25561 Pain in right knee: Secondary | ICD-10-CM | POA: Diagnosis not present

## 2019-10-25 ENCOUNTER — Encounter: Payer: Self-pay | Admitting: Internal Medicine

## 2019-10-25 DIAGNOSIS — M25562 Pain in left knee: Secondary | ICD-10-CM | POA: Diagnosis not present

## 2019-10-30 DIAGNOSIS — M25562 Pain in left knee: Secondary | ICD-10-CM | POA: Diagnosis not present

## 2019-10-30 DIAGNOSIS — M25561 Pain in right knee: Secondary | ICD-10-CM | POA: Diagnosis not present

## 2019-10-31 DIAGNOSIS — M1712 Unilateral primary osteoarthritis, left knee: Secondary | ICD-10-CM | POA: Diagnosis not present

## 2019-11-07 DIAGNOSIS — M1712 Unilateral primary osteoarthritis, left knee: Secondary | ICD-10-CM | POA: Diagnosis not present

## 2019-11-08 DIAGNOSIS — M25561 Pain in right knee: Secondary | ICD-10-CM | POA: Diagnosis not present

## 2019-11-08 DIAGNOSIS — M25562 Pain in left knee: Secondary | ICD-10-CM | POA: Diagnosis not present

## 2019-11-09 ENCOUNTER — Encounter: Payer: Self-pay | Admitting: Internal Medicine

## 2019-11-10 ENCOUNTER — Encounter: Payer: Self-pay | Admitting: Internal Medicine

## 2019-11-10 MED ORDER — SERTRALINE HCL 50 MG PO TABS: 50.0000 mg | ORAL_TABLET | Freq: Every day | ORAL | 0 refills | Status: DC

## 2019-11-12 ENCOUNTER — Other Ambulatory Visit: Payer: Self-pay | Admitting: Internal Medicine

## 2019-11-14 DIAGNOSIS — M1712 Unilateral primary osteoarthritis, left knee: Secondary | ICD-10-CM | POA: Diagnosis not present

## 2019-11-16 DIAGNOSIS — M25562 Pain in left knee: Secondary | ICD-10-CM | POA: Diagnosis not present

## 2019-11-17 ENCOUNTER — Encounter: Payer: Self-pay | Admitting: Internal Medicine

## 2019-11-20 DIAGNOSIS — M25562 Pain in left knee: Secondary | ICD-10-CM | POA: Diagnosis not present

## 2019-11-29 ENCOUNTER — Other Ambulatory Visit: Payer: Self-pay | Admitting: Internal Medicine

## 2019-11-29 DIAGNOSIS — M25562 Pain in left knee: Secondary | ICD-10-CM | POA: Diagnosis not present

## 2019-12-05 ENCOUNTER — Encounter: Payer: Self-pay | Admitting: Internal Medicine

## 2019-12-06 DIAGNOSIS — M25562 Pain in left knee: Secondary | ICD-10-CM | POA: Diagnosis not present

## 2019-12-13 DIAGNOSIS — M25562 Pain in left knee: Secondary | ICD-10-CM | POA: Diagnosis not present

## 2019-12-21 DIAGNOSIS — M25562 Pain in left knee: Secondary | ICD-10-CM | POA: Diagnosis not present

## 2020-01-09 ENCOUNTER — Other Ambulatory Visit: Payer: Self-pay

## 2020-01-09 ENCOUNTER — Ambulatory Visit (INDEPENDENT_AMBULATORY_CARE_PROVIDER_SITE_OTHER): Payer: BC Managed Care – PPO | Admitting: Internal Medicine

## 2020-01-09 ENCOUNTER — Encounter: Payer: Self-pay | Admitting: Internal Medicine

## 2020-01-09 VITALS — BP 119/76 | HR 58 | Temp 98.2°F | Resp 16 | Ht 64.0 in | Wt 176.4 lb

## 2020-01-09 DIAGNOSIS — E119 Type 2 diabetes mellitus without complications: Secondary | ICD-10-CM | POA: Diagnosis not present

## 2020-01-09 DIAGNOSIS — E785 Hyperlipidemia, unspecified: Secondary | ICD-10-CM | POA: Diagnosis not present

## 2020-01-09 DIAGNOSIS — Z1231 Encounter for screening mammogram for malignant neoplasm of breast: Secondary | ICD-10-CM

## 2020-01-09 DIAGNOSIS — Z0000 Encounter for general adult medical examination without abnormal findings: Secondary | ICD-10-CM | POA: Diagnosis not present

## 2020-01-09 LAB — COMPREHENSIVE METABOLIC PANEL WITH GFR
ALT: 8 U/L (ref 0–35)
AST: 16 U/L (ref 0–37)
Albumin: 4.6 g/dL (ref 3.5–5.2)
Alkaline Phosphatase: 71 U/L (ref 39–117)
BUN: 19 mg/dL (ref 6–23)
CO2: 30 meq/L (ref 19–32)
Calcium: 9.9 mg/dL (ref 8.4–10.5)
Chloride: 101 meq/L (ref 96–112)
Creatinine, Ser: 0.71 mg/dL (ref 0.40–1.20)
GFR: 83.61 mL/min
Glucose, Bld: 107 mg/dL — ABNORMAL HIGH (ref 70–99)
Potassium: 4.8 meq/L (ref 3.5–5.1)
Sodium: 137 meq/L (ref 135–145)
Total Bilirubin: 0.6 mg/dL (ref 0.2–1.2)
Total Protein: 6.9 g/dL (ref 6.0–8.3)

## 2020-01-09 LAB — LIPID PANEL
Cholesterol: 246 mg/dL — ABNORMAL HIGH (ref 0–200)
HDL: 80.5 mg/dL
LDL Cholesterol: 146 mg/dL — ABNORMAL HIGH (ref 0–99)
NonHDL: 165.64
Total CHOL/HDL Ratio: 3
Triglycerides: 96 mg/dL (ref 0.0–149.0)
VLDL: 19.2 mg/dL (ref 0.0–40.0)

## 2020-01-09 LAB — MICROALBUMIN / CREATININE URINE RATIO
Creatinine,U: 49.2 mg/dL
Microalb Creat Ratio: 1.4 mg/g (ref 0.0–30.0)
Microalb, Ur: <0.7 mg/dL (ref 0.0–1.9)

## 2020-01-09 LAB — HEMOGLOBIN A1C: Hgb A1c MFr Bld: 6.4 % (ref 4.6–6.5)

## 2020-01-09 NOTE — Progress Notes (Signed)
Pre visit review using our clinic review tool, if applicable. No additional management support is needed unless otherwise documented below in the visit note. 

## 2020-01-09 NOTE — Progress Notes (Signed)
Subjective:    Patient ID: Janice Shaw, female    DOB: 1960-10-03, 59 y.o.   MRN: [id number]  DOS:  01/09/2020 Type of visit - description: Here for CPX  In general feels well. Does have aches and pains mostly at the knees. Takes ibuprofen sporadically.  Review of Systems  Other than above, a 14 point review of systems is negative     Past Medical History:  Diagnosis Date   Asthma    Diabetes ([place]) 02/14/2013   Dyslipidemia 02/14/2013   Elevated BP    took HCTZ   GERD (gastroesophageal reflux disease)    Osteochondroma    Left femur   Pancreatitis 02/14/2013   Due to HCTZ ? Saw Dr Collene Mares     Past Surgical History:  Procedure Laterality Date   KNEE ARTHROSCOPY Left 07/31/2019   PARTIAL HYSTERECTOMY  2009   no oophorectomy   removal of VIN     thrombosed hemorroid      Allergies as of 01/09/2020      Reactions   Codeine Hives, Itching   Aspirin    unknown reaction, childhood allergy.   Hctz [hydrochlorothiazide]    ?pancreatitis   Erythromycin Rash   Penicillins Other (See Comments)   childhood allergy      Medication List       Accurate as of January 09, 2020 11:59 PM. If you have any questions, ask your nurse or doctor.        STOP taking these medications   atorvastatin 10 MG tablet Commonly known as: LIPITOR Stopped by: Kathlene November, MD   [place] by: Kathlene November, MD   naproxen 500 MG tablet Commonly known as: NAPROSYN Stopped by: Kathlene November, MD   valACYclovir 1000 MG tablet Commonly known as: Valtrex Stopped by: Kathlene November, MD     TAKE these medications   budesonide-formoterol 80-4.5 MCG/ACT inhaler Commonly known as: Symbicort Inhale 2 puffs into the lungs 2 (two) times daily.   Magnesium 500 MG Caps Take 1 capsule by mouth daily.   omeprazole 20 MG capsule Commonly known as: PRILOSEC Take 1 capsule (20 mg total) by mouth daily.   ProAir HFA 108 (90 Base) MCG/ACT inhaler Generic drug: albuterol Inhale 2 puffs  into the lungs every 6 (six) hours as needed for wheezing. Reported on 08/23/2015   PROBIOTIC DAILY PO Take 1 tablet by mouth daily.   sertraline 50 MG tablet Commonly known as: ZOLOFT Take 1 tablet (50 mg total) by mouth daily.   vitamin C 500 MG tablet Commonly known as: ASCORBIC ACID Take 500 mg by mouth daily.   zolpidem 10 MG tablet Commonly known as: AMBIEN Take 1 tablet (10 mg total) by mouth at bedtime as needed for sleep.          Objective:   Physical Exam BP 119/76 (BP Location: Left Arm, Patient Position: Sitting, Cuff Size: Small)    Pulse (!) 58    Temp 98.2 F (36.8 C) (Oral)    Resp 16    Ht 5\' 4"  (1.626 m)    Wt 176 lb 6 oz (80 kg)    SpO2 98%    BMI 30.27 kg/m  General: Well developed, NAD, BMI noted Neck: No  thyromegaly  HEENT:  Normocephalic . Face symmetric, atraumatic Lungs:  CTA B Normal respiratory effort, no intercostal retractions, no accessory muscle use. Heart: RRR,  no murmur.  Abdomen:  Not distended, soft, non-tender. No rebound or rigidity.   Foot  exam: No edema, normal pulses, pinprick normal. Skin: Exposed areas without rash. Not pale. Not jaundice Neurologic:  alert & oriented X3.  Speech normal, gait appropriate for age and unassisted Strength symmetric and appropriate for age.  Psych: Cognition and judgment appear intact.  Cooperative with normal attention span and concentration.  Behavior appropriate. No anxious or depressed appearing.     Assessment     Assessment DM a1c  7.1 (2014) Elevated BP   Dyslipidemia Anxiety, Insomnia  GERD Cough variant Asthma, seasonal  Pancreatitis 2014, d/t HCTZ? Dr. Collene Mares Shingles, facial, [phone]  PLAN: Here for CPX DM: Diet controlled, check A1c and micro Dyslipidemia: Stopped Lipitor because she was having aches and pains, unclear if that has helped.  She is willing to try statins again, we could perhaps try something different like Pravachol or Crestor every other day.  Checking  labs Asthma: Well-controlled MSK: Has aches and pains mostly at the knees, follow-up by Ortho. RTC 6 months   This visit occurred during the SARS-CoV-2 public health emergency.  Safety protocols were in place, including screening questions prior to the visit, additional usage of staff PPE, and extensive cleaning of exam room while observing appropriate contact time as indicated for disinfecting solutions.

## 2020-01-09 NOTE — Patient Instructions (Addendum)
Per our records you are due for an eye exam. Please contact your eye doctor to schedule an appointment. Please have them send copies of your office visit notes to [place]. Our fax number is [phone].  When you take ibuprofen follow these instructions: Always take it with food because may cause gastritis and ulcers.  If you notice nausea, stomach pain, change in the color of stools --->  Stop the medicine and let us know   Check the  blood pressure regularly BP GOAL is between 110/65 and  135/85. If it is consistently higher or lower, let me know  See your eye doctor  See your gynecologist  GO TO THE LAB : Get the blood work     [place], [place] back for a checkup in 6 months   STOP BY THE FIRST FLOOR: Schedule mammogram    LEARN ABOUT CALCIUM AND VITAMIN D  Calcium and Vitamin D are important to help keep your bones healthy and prevent osteoporosis. Healthy calcium and Vitamin D levels can be reached by increasing calcium and vitamin D in your diet, or by taking supplements over the counter.  The current recommendations are as follows:  Increasing calcium and vitamin D is generally well tolerated in most people. Some people may get constipated (with CALCIUM).   RECCOMENDATIONS -Postmenopausal women:    1200mg  calcium and 800 IU (20 micrograms) vitamin D daily   Below are some examples of foods high in calcium and vitamin D:  Foods with Vitamin D FOOD Vitamin D Amount  3 oz Salmon 380-570 IU  8 oz Milk (nonfat, reduced fat, and whole) 100 IU  6 oz Yogurt, fortified with vitamin D 80 IU  8 oz Orange Juice, fortified with vitamin D 100 IU  1 Egg 25 IU   Foods with Calcium FOOD Calcium Amount  1 cup of milk 300 mg  1 cup of yogurt 450 mg  1 oz cheese 200 mg  1 cup of spinach 240 mg  8 oz orange juice, fortified with calcium 300 mg

## 2020-01-10 ENCOUNTER — Encounter: Payer: Self-pay | Admitting: Internal Medicine

## 2020-01-10 NOTE — Assessment & Plan Note (Signed)
-  Tdap 2015 - PNA 23 2016, prevnar 2017 - shingrix discussed before, DX with shingles recently.  Okay to wait for now. -Had Covid vaccination -Rec flu shot q. Year -Female care: s/p hyst, still has ovaries, encouraged to be seen,  declined referral  MMG 03/2017 overdue, placed a order  -CCS: Patient reported 2009- polyps removed, cscope done again 07/2017: next per GI;  Dr Collene Mares -Diet-exercise: Discussed -Labs: CMP, FLP, A1c, micro -Last vitamin D level satisfactory, not taking supplements, encouraged calcium and vitamin D.

## 2020-01-10 NOTE — Assessment & Plan Note (Signed)
Here for CPX DM: Diet controlled, check A1c and micro Dyslipidemia: Stopped Lipitor because she was having aches and pains, unclear if that has helped.  She is willing to try statins again, we could perhaps try something different like Pravachol or Crestor every other day.  Checking labs Asthma: Well-controlled MSK: Has aches and pains mostly at the knees, follow-up by Ortho. RTC 6 months

## 2020-01-11 ENCOUNTER — Ambulatory Visit (HOSPITAL_BASED_OUTPATIENT_CLINIC_OR_DEPARTMENT_OTHER)
Admission: RE | Admit: 2020-01-11 | Discharge: 2020-01-11 | Disposition: A | Discharge status: Home / Self Care | Payer: BC Managed Care – PPO | Source: Ambulatory Visit | Attending: Internal Medicine | Admitting: Internal Medicine

## 2020-01-11 ENCOUNTER — Other Ambulatory Visit: Payer: Self-pay

## 2020-01-11 DIAGNOSIS — Z1231 Encounter for screening mammogram for malignant neoplasm of breast: Secondary | ICD-10-CM | POA: Diagnosis not present

## 2020-01-11 MED ORDER — PRAVASTATIN SODIUM 20 MG PO TABS
20.0000 mg | ORAL_TABLET | Freq: Every day | ORAL | 2 refills | Status: DC
Start: 2020-01-11 — End: 2020-04-17

## 2020-01-11 NOTE — Addendum Note (Signed)
Addended byDamita Dunnings D on: 01/11/2020 02:05 PM   Modules accepted: Orders

## 2020-02-02 ENCOUNTER — Telehealth: Payer: Self-pay | Admitting: Internal Medicine

## 2020-02-02 NOTE — Telephone Encounter (Signed)
-----   Message from [place], [place] sent at 01/15/2020  7:56 AM EDT ----- Regarding: Lab appt Needs labs in 6 weeks please (fasting).

## 2020-02-02 NOTE — Telephone Encounter (Signed)
lvm to schedule lab appt

## 2020-02-03 ENCOUNTER — Other Ambulatory Visit: Payer: Self-pay | Admitting: Internal Medicine

## 2020-04-05 ENCOUNTER — Encounter: Payer: Self-pay | Admitting: Internal Medicine

## 2020-04-11 ENCOUNTER — Ambulatory Visit (INDEPENDENT_AMBULATORY_CARE_PROVIDER_SITE_OTHER): Payer: BC Managed Care – PPO | Admitting: Podiatry

## 2020-04-11 ENCOUNTER — Encounter: Payer: Self-pay | Admitting: Podiatry

## 2020-04-11 ENCOUNTER — Other Ambulatory Visit: Payer: Self-pay

## 2020-04-11 DIAGNOSIS — L600 Ingrowing nail: Secondary | ICD-10-CM

## 2020-04-11 MED ORDER — NEOMYCIN-POLYMYXIN-HC 3.5-10000-1 OT SOLN
OTIC | 1 refills | Status: DC
Start: 2020-04-11 — End: 2021-02-20

## 2020-04-11 NOTE — Patient Instructions (Signed)

## 2020-04-12 NOTE — Progress Notes (Signed)
Subjective:   Patient ID: Janice Shaw, female   DOB: 59 y.o.   MRN: [id number]   HPI Patient presents stating she has a very thickened damaged big toenail left that she has tried to cut and no longer can and it is increasingly sore and hard for her to wear shoe gear with.  Patient states that she traumatized it years ago and patient does not smoke likes to be active   Review of Systems  All other systems reviewed and are negative.       Objective:  Physical Exam Vitals and nursing note reviewed.  Constitutional:      Appearance: She is well-developed.  Pulmonary:     Effort: Pulmonary effort is normal.  Musculoskeletal:        General: Normal range of motion.  Skin:    General: Skin is warm.  Neurological:     Mental Status: She is alert.     Neurovascular status intact muscle strength found to be adequate range of motion within normal limits.  Patient is found to have a thickened dystrophic painful big toenail left that is loose and sore and hard for her to wear shoe gear with.  Patient is noted to have good digital perfusion well oriented x3     Assessment:  Traumatized painful left big toenail left that she has had for years and wants removed like her right 1     Plan:  H&P and reviewed condition and recommended removal.  I do recommend permanent and I allowed her to read consent form for correction understanding risk and today I infiltrated the left hallux 60 mg like Marcaine mixture sterile prep done and using sterile instrumentation I remove the nail entirely I exposed the matrix and applied phenol 5 applications 30 seconds followed by alcohol lavage sterile dressing and gave instructions on soaks.  Encouraged patient to call with any questions and leave dressing on 24 hours but take it off earlier if any throbbing were to occur

## 2020-04-17 ENCOUNTER — Other Ambulatory Visit: Payer: Self-pay | Admitting: Internal Medicine

## 2020-05-02 ENCOUNTER — Other Ambulatory Visit: Payer: Self-pay

## 2020-05-02 ENCOUNTER — Encounter: Payer: Self-pay | Admitting: Podiatry

## 2020-05-02 ENCOUNTER — Ambulatory Visit (INDEPENDENT_AMBULATORY_CARE_PROVIDER_SITE_OTHER): Payer: BC Managed Care – PPO | Admitting: Podiatry

## 2020-05-02 DIAGNOSIS — L600 Ingrowing nail: Secondary | ICD-10-CM

## 2020-05-02 NOTE — Progress Notes (Signed)
Subjective:   Patient ID: Janice Shaw, female   DOB: 59 y.o.   MRN: [id number]   HPI Patient states doing well with surgery   ROS      Objective:  Physical Exam  Neurovascular status intact with good healing of the left hallux after nail removal     Assessment:  Doing well post nail removal left permanent     Plan:  H&P reviewed condition continue soaks discharge see back as needed

## 2020-07-11 ENCOUNTER — Ambulatory Visit: Payer: BC Managed Care – PPO | Admitting: Internal Medicine

## 2020-07-19 DIAGNOSIS — K589 Irritable bowel syndrome without diarrhea: Secondary | ICD-10-CM | POA: Insufficient documentation

## 2020-07-22 ENCOUNTER — Encounter: Payer: Self-pay | Admitting: Internal Medicine

## 2020-07-22 ENCOUNTER — Other Ambulatory Visit: Payer: Self-pay

## 2020-07-22 ENCOUNTER — Ambulatory Visit (INDEPENDENT_AMBULATORY_CARE_PROVIDER_SITE_OTHER): Payer: BC Managed Care – PPO | Admitting: Internal Medicine

## 2020-07-22 VITALS — BP 132/74 | HR 76 | Temp 97.8°F | Resp 16 | Ht 64.0 in | Wt 188.4 lb

## 2020-07-22 DIAGNOSIS — E119 Type 2 diabetes mellitus without complications: Secondary | ICD-10-CM

## 2020-07-22 DIAGNOSIS — R635 Abnormal weight gain: Secondary | ICD-10-CM | POA: Diagnosis not present

## 2020-07-22 DIAGNOSIS — E785 Hyperlipidemia, unspecified: Secondary | ICD-10-CM

## 2020-07-22 LAB — HEMOGLOBIN A1C: Hgb A1c MFr Bld: 6.4 % (ref 4.6–6.5)

## 2020-07-22 LAB — LIPID PANEL
Cholesterol: 197 mg/dL (ref 0–200)
HDL: 74 mg/dL (ref >39.00)
LDL Cholesterol: 107 mg/dL — ABNORMAL HIGH (ref 0–99)
NonHDL: 123.16
Total CHOL/HDL Ratio: 3
Triglycerides: 79 mg/dL (ref 0.0–149.0)
VLDL: 15.8 mg/dL (ref 0.0–40.0)

## 2020-07-22 LAB — AST: AST: 16 U/L (ref 0–37)

## 2020-07-22 LAB — ALT: ALT: 8 U/L (ref 0–35)

## 2020-07-22 LAB — TSH: TSH: 2.66 u[IU]/mL (ref 0.35–4.50)

## 2020-07-22 NOTE — Patient Instructions (Addendum)
Per our records you are due for an eye exam. Please contact your eye doctor to schedule an appointment. Please have them send copies of your office visit notes to [place]. Our fax number is [phone].    GO TO THE LAB : Get the blood work     [place], [place] Come back for a physical exam by 12-2020

## 2020-07-22 NOTE — Progress Notes (Signed)
Subjective:    Patient ID: Janice Shaw, female    DOB: 26-Aug-1960, 60 y.o.   MRN: [id number]  DOS:  07/22/2020 Type of visit - description: f/u Since  the last office visit is feeling well. Has been unable to exercise much. She is concerned about weight gain. Started cholesterol medication, no apparent side effects  Wt Readings from Last 3 Encounters:  07/22/20 188 lb 6 oz (85.4 kg)  01/09/20 176 lb 6 oz (80 kg)  03/11/18 170 lb 2 oz (77.2 kg)     Review of Systems See above   Past Medical History:  Diagnosis Date  . Asthma   . Diabetes ([place]) 02/14/2013  . Dyslipidemia 02/14/2013  . Elevated BP    took HCTZ  . GERD (gastroesophageal reflux disease)   . Osteochondroma    Left femur  . Pancreatitis 02/14/2013   Due to HCTZ ? Saw Dr Collene Mares     Past Surgical History:  Procedure Laterality Date  . KNEE ARTHROSCOPY Left 07/31/2019  . PARTIAL HYSTERECTOMY  2009   no oophorectomy  . removal of VIN    . thrombosed hemorroid      Allergies as of 07/22/2020      Reactions   Codeine Hives, Itching   Aspirin    unknown reaction, childhood allergy.   Hctz [hydrochlorothiazide]    ?pancreatitis   Erythromycin Rash   Penicillins Other (See Comments)   childhood allergy      Medication List       Accurate as of July 22, 2020 11:59 PM. If you have any questions, ask your nurse or doctor.        albuterol 108 (90 Base) MCG/ACT inhaler Commonly known as: VENTOLIN HFA Inhale 2 puffs into the lungs every 6 (six) hours as needed for wheezing. Reported on 08/23/2015   budesonide-formoterol 80-4.5 MCG/ACT inhaler Commonly known as: Symbicort Inhale 2 puffs into the lungs 2 (two) times daily.   Magnesium 500 MG Caps Take 1 capsule by mouth daily.   neomycin-polymyxin-hydrocortisone OTIC solution Commonly known as: CORTISPORIN Apply 1-2 drops to toe after soaking BID   omeprazole 20 MG capsule Commonly known as: PRILOSEC Take 1 capsule (20 mg total) by mouth  daily.   pravastatin 20 MG tablet Commonly known as: PRAVACHOL Take 1 tablet (20 mg total) by mouth at bedtime.   PROBIOTIC DAILY PO Take 1 tablet by mouth daily.   sertraline 50 MG tablet Commonly known as: ZOLOFT Take 1 tablet (50 mg total) by mouth daily.   vitamin C 500 MG tablet Commonly known as: ASCORBIC ACID Take 500 mg by mouth daily.   zolpidem 10 MG tablet Commonly known as: AMBIEN Take 1 tablet (10 mg total) by mouth at bedtime as needed for sleep.          Objective:   Physical Exam BP 132/74 (BP Location: Left Arm, Patient Position: Sitting, Cuff Size: Small)   Pulse 76   Temp 97.8 F (36.6 C) (Oral)   Resp 16   Ht 5\' 4"  (1.626 m)   Wt 188 lb 6 oz (85.4 kg)   SpO2 98%   BMI 32.33 kg/m  General:   Well developed, NAD, BMI noted. HEENT:  Normocephalic . Face symmetric, atraumatic Lungs:  CTA B Normal respiratory effort, no intercostal retractions, no accessory muscle use. Heart: RRR,  no murmur.  Lower extremities: no pretibial edema bilaterally  Skin: Not pale. Not jaundice Neurologic:  alert & oriented X3.  Speech normal, gait  appropriate for age and unassisted Psych--  Cognition and judgment appear intact.  Cooperative with normal attention span and concentration.  Behavior appropriate. No anxious or depressed appearing.      Assessment      Assessment DM a1c  7.1 (2014) Elevated BP   Dyslipidemia Anxiety, Insomnia  GERD Cough variant Asthma, seasonal  Pancreatitis 2014, d/t HCTZ? Dr. Collene Mares Shingles, facial, [phone]  PLAN: DM: Last A1c was 6.4, currently diet controlled.  Weight gain noted since the last visit, she is determined to do better and is starting intermittent fasting.  I encouraged her to increase her physical activity.  Recheck A1c, only consider medication if it is really elevated. Hyperlipidemia: Last LDL 146, started Pravachol, good compliance, no side effects, checking labs Weight gain: See comments under DM,  requests check a TSH.  Will do. RTC 12-2020 CPX  This visit occurred during the SARS-CoV-2 public health emergency.  Safety protocols were in place, including screening questions prior to the visit, additional usage of staff PPE, and extensive cleaning of exam room while observing appropriate contact time as indicated for disinfecting solutions.

## 2020-07-22 NOTE — Progress Notes (Signed)
Pre visit review using our clinic review tool, if applicable. No additional management support is needed unless otherwise documented below in the visit note. 

## 2020-07-23 NOTE — Assessment & Plan Note (Signed)
DM: Last A1c was 6.4, currently diet controlled.  Weight gain noted since the last visit, she is determined to do better and is starting intermittent fasting.  I encouraged her to increase her physical activity.  Recheck A1c, only consider medication if it is really elevated. Hyperlipidemia: Last LDL 146, started Pravachol, good compliance, no side effects, checking labs Weight gain: See comments under DM, requests check a TSH.  Will do. RTC 12-2020 CPX

## 2020-07-24 MED ORDER — SERTRALINE HCL 50 MG PO TABS: 50.0000 mg | ORAL_TABLET | Freq: Every day | ORAL | 1 refills | Status: DC

## 2020-07-24 MED ORDER — PRAVASTATIN SODIUM 20 MG PO TABS: 20.0000 mg | ORAL_TABLET | Freq: Every day | ORAL | 1 refills | Status: DC

## 2020-07-24 NOTE — Addendum Note (Signed)
Addended byDamita Dunnings D on: 07/24/2020 04:45 PM   Modules accepted: Orders

## 2020-09-05 ENCOUNTER — Encounter: Payer: Self-pay | Admitting: Internal Medicine

## 2020-10-19 IMAGING — MG DIGITAL SCREENING BILAT W/ TOMO W/ CAD
6 of 10 series · 6 of 30 positions shown · non-contrast
Comparison: Previous exam(s).

CLINICAL DATA: Screening.

EXAM:
DIGITAL SCREENING BILATERAL MAMMOGRAM WITH TOMO AND CAD

[L MLO synth-2D]
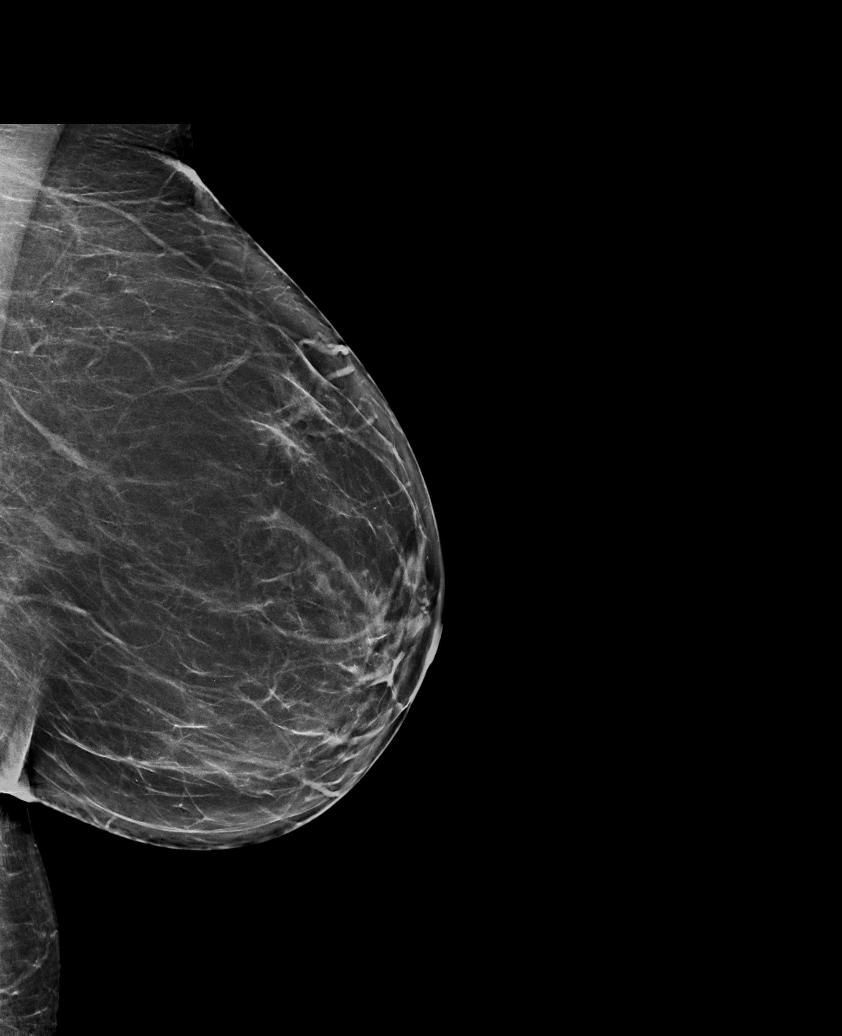

[R CC synth-2D]
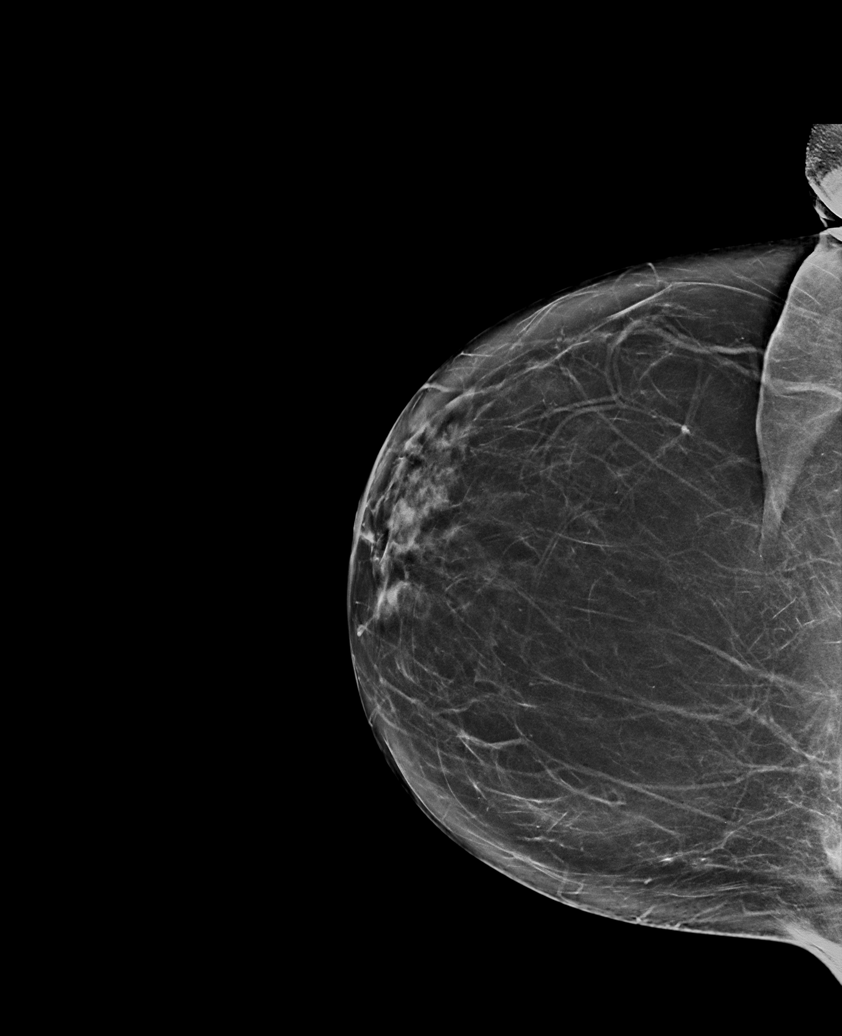

[L CC synth-2D]
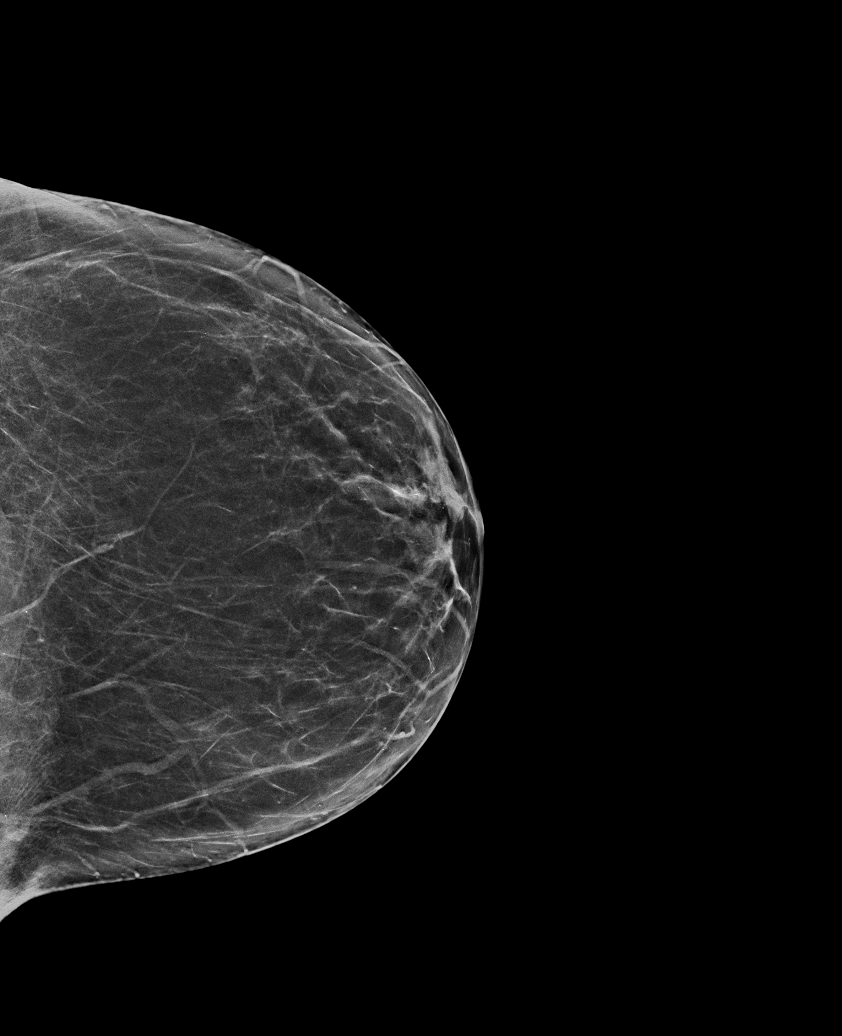

[R XCCL synth-2D]
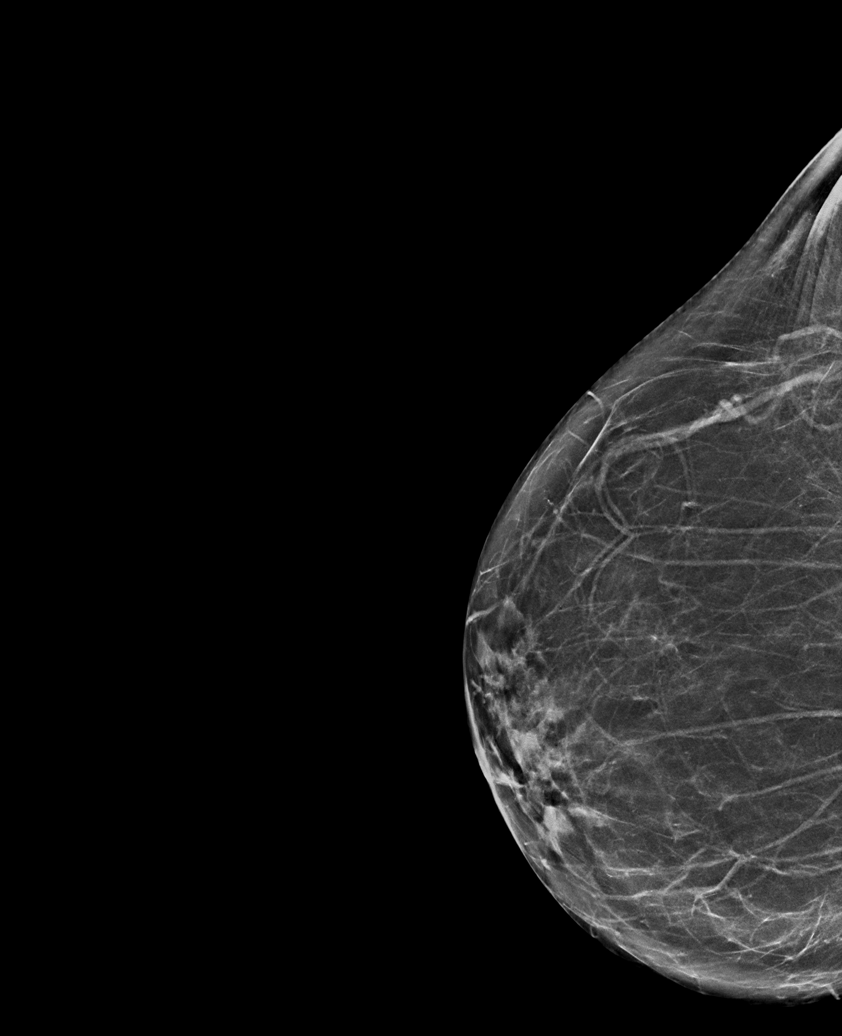

[R MLO synth-2D]
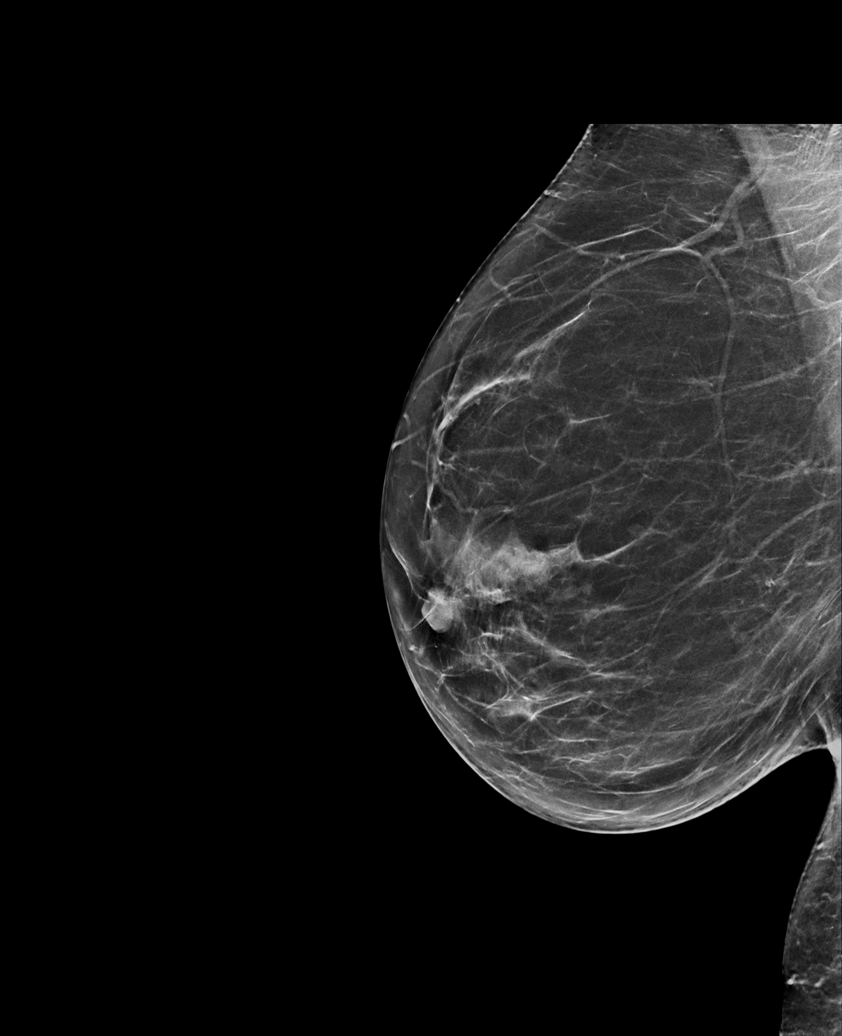

[L MLO tomo · tomo slice 35/69.0]
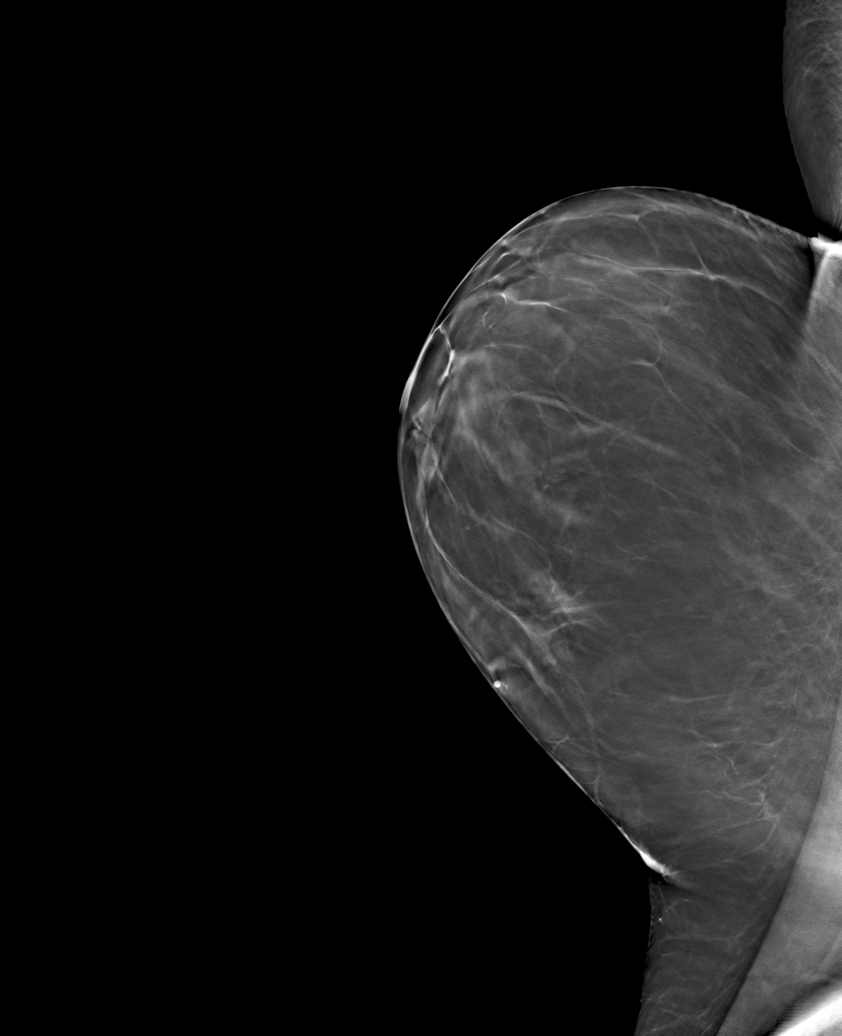

[6 of 30 positions shown; findings below may reference images not displayed]

ACR Breast Density Category b: There are scattered areas of
fibroglandular density.
FINDINGS: There are no findings suspicious for malignancy. Images were
processed with CAD.
IMPRESSION: No mammographic evidence of malignancy. A result letter of this
screening mammogram will be mailed directly to the patient.

RECOMMENDATION:
Screening mammogram in one year. (Code:CN-U-775)

BI-RADS CATEGORY  1: Negative.

## 2020-11-04 DIAGNOSIS — H524 Presbyopia: Secondary | ICD-10-CM | POA: Diagnosis not present

## 2020-11-04 LAB — HM DIABETES EYE EXAM

## 2020-11-27 ENCOUNTER — Encounter: Payer: Self-pay | Admitting: Internal Medicine

## 2020-11-27 MED ORDER — OMEPRAZOLE 20 MG PO CPDR: 20.0000 mg | DELAYED_RELEASE_CAPSULE | Freq: Every day | ORAL | 1 refills | Status: DC

## 2020-11-28 ENCOUNTER — Other Ambulatory Visit (HOSPITAL_BASED_OUTPATIENT_CLINIC_OR_DEPARTMENT_OTHER): Payer: Self-pay | Admitting: Internal Medicine

## 2020-11-28 DIAGNOSIS — Z1231 Encounter for screening mammogram for malignant neoplasm of breast: Secondary | ICD-10-CM

## 2021-01-20 ENCOUNTER — Encounter: Payer: BC Managed Care – PPO | Admitting: Internal Medicine

## 2021-01-31 ENCOUNTER — Other Ambulatory Visit: Payer: Self-pay | Admitting: Internal Medicine

## 2021-02-04 ENCOUNTER — Ambulatory Visit (HOSPITAL_BASED_OUTPATIENT_CLINIC_OR_DEPARTMENT_OTHER)
Admission: RE | Admit: 2021-02-04 | Discharge: 2021-02-04 | Disposition: A | Discharge status: Home / Self Care | Payer: BC Managed Care – PPO | Source: Ambulatory Visit | Attending: Internal Medicine | Admitting: Internal Medicine

## 2021-02-04 ENCOUNTER — Encounter (HOSPITAL_BASED_OUTPATIENT_CLINIC_OR_DEPARTMENT_OTHER): Payer: Self-pay

## 2021-02-04 ENCOUNTER — Other Ambulatory Visit: Payer: Self-pay

## 2021-02-04 DIAGNOSIS — Z1231 Encounter for screening mammogram for malignant neoplasm of breast: Secondary | ICD-10-CM | POA: Insufficient documentation

## 2021-02-10 ENCOUNTER — Encounter: Payer: BC Managed Care – PPO | Admitting: Internal Medicine

## 2021-02-20 ENCOUNTER — Ambulatory Visit (INDEPENDENT_AMBULATORY_CARE_PROVIDER_SITE_OTHER): Payer: BC Managed Care – PPO | Admitting: Internal Medicine

## 2021-02-20 ENCOUNTER — Other Ambulatory Visit: Payer: Self-pay

## 2021-02-20 ENCOUNTER — Encounter: Payer: Self-pay | Admitting: Internal Medicine

## 2021-02-20 VITALS — BP 128/82 | HR 59 | Temp 98.3°F | Resp 16 | Ht 64.0 in | Wt 177.5 lb

## 2021-02-20 DIAGNOSIS — E119 Type 2 diabetes mellitus without complications: Secondary | ICD-10-CM

## 2021-02-20 DIAGNOSIS — E785 Hyperlipidemia, unspecified: Secondary | ICD-10-CM | POA: Diagnosis not present

## 2021-02-20 DIAGNOSIS — Z0000 Encounter for general adult medical examination without abnormal findings: Secondary | ICD-10-CM

## 2021-02-20 LAB — CBC WITH DIFFERENTIAL/PLATELET
Basophils Absolute: 0 10*3/uL (ref 0.0–0.1)
Basophils Relative: 0.6 % (ref 0.0–3.0)
Eosinophils Absolute: 0 10*3/uL (ref 0.0–0.7)
Eosinophils Relative: 0.9 % (ref 0.0–5.0)
HCT: 43.4 % (ref 36.0–46.0)
Hemoglobin: 14.2 g/dL (ref 12.0–15.0)
Lymphocytes Relative: 19.8 % (ref 12.0–46.0)
Lymphs Abs: 1.1 10*3/uL (ref 0.7–4.0)
MCHC: 32.7 g/dL (ref 30.0–36.0)
MCV: 90.4 fl (ref 78.0–100.0)
Monocytes Absolute: 0.4 10*3/uL (ref 0.1–1.0)
Monocytes Relative: 6.8 % (ref 3.0–12.0)
Neutro Abs: 4 10*3/uL (ref 1.4–7.7)
Neutrophils Relative %: 71.9 % (ref 43.0–77.0)
Platelets: 198 10*3/uL (ref 150.0–400.0)
RBC: 4.8 Mil/uL (ref 3.87–5.11)
RDW: 13.2 % (ref 11.5–15.5)
WBC: 5.6 10*3/uL (ref 4.0–10.5)

## 2021-02-20 LAB — MICROALBUMIN / CREATININE URINE RATIO
Creatinine,U: 210.5 mg/dL
Microalb Creat Ratio: 0.8 mg/g (ref 0.0–30.0)
Microalb, Ur: 1.6 mg/dL (ref 0.0–1.9)

## 2021-02-20 LAB — COMPREHENSIVE METABOLIC PANEL
ALT: 8 U/L (ref 0–35)
AST: 16 U/L (ref 0–37)
Albumin: 4.6 g/dL (ref 3.5–5.2)
Alkaline Phosphatase: 60 U/L (ref 39–117)
BUN: 14 mg/dL (ref 6–23)
CO2: 30 mEq/L (ref 19–32)
Calcium: 9.9 mg/dL (ref 8.4–10.5)
Chloride: 104 mEq/L (ref 96–112)
Creatinine, Ser: 0.73 mg/dL (ref 0.40–1.20)
GFR: 88.14 mL/min (ref >60.00)
Glucose, Bld: 131 mg/dL — ABNORMAL HIGH (ref 70–99)
Potassium: 4.8 mEq/L (ref 3.5–5.1)
Sodium: 140 mEq/L (ref 135–145)
Total Bilirubin: 0.6 mg/dL (ref 0.2–1.2)
Total Protein: 6.8 g/dL (ref 6.0–8.3)

## 2021-02-20 LAB — LIPID PANEL
Cholesterol: 201 mg/dL — ABNORMAL HIGH (ref 0–200)
HDL: 79.5 mg/dL (ref >39.00)
LDL Cholesterol: 103 mg/dL — ABNORMAL HIGH (ref 0–99)
NonHDL: 121.93
Total CHOL/HDL Ratio: 3
Triglycerides: 97 mg/dL (ref 0.0–149.0)
VLDL: 19.4 mg/dL (ref 0.0–40.0)

## 2021-02-20 LAB — HEMOGLOBIN A1C: Hgb A1c MFr Bld: 6.5 % (ref 4.6–6.5)

## 2021-02-20 NOTE — Assessment & Plan Note (Signed)
-  Tdap 2015 - PNA 23 2016, prevnar 2017 - shingrix discussed, declined for now  . -Had Covid x3 , rec booster  -Rec flu shot q. year -Female care: s/p hyst, still has ovaries, rec to see gyn  MMG 02-04-21 ([facility]) -CCS: Patient reported 2009- polyps removed, cscope done again 07/2017: next per GI;  Dr Collene Mares -Diet-exercise: Discussed -Labs: CMP, FLP, CBC, A1c, micro -- Healthcare power of attorney discussed

## 2021-02-20 NOTE — Progress Notes (Signed)
Subjective:    Patient ID: Janice Shaw, female    DOB: 20-Nov-1960, 60 y.o.   MRN: [id number]  DOS:  02/20/2021 Type of visit - description: CPX  Since the last office visit she is doing okay. Some stress, work-related but managing well.   Review of Systems  Other than above, a 14 point review of systems is negative      Past Medical History:  Diagnosis Date   Asthma    Diabetes ([place]) 02/14/2013   Dyslipidemia 02/14/2013   Elevated BP    took HCTZ   GERD (gastroesophageal reflux disease)    Osteochondroma    Left femur   Pancreatitis 02/14/2013   Due to HCTZ ? Saw Dr Collene Mares     Past Surgical History:  Procedure Laterality Date   KNEE ARTHROSCOPY Left 07/31/2019   PARTIAL HYSTERECTOMY  2009   no oophorectomy   removal of VIN     thrombosed hemorroid     Social History   Socioeconomic History   Marital status: Married    Spouse name: Not on file   Number of children: 0   Years of education: Not on file   Highest education level: Not on file  Occupational History   Occupation: para Occupational psychologist: [employer]  Tobacco Use   Smoking status: Former    Packs/day: 0.50    Years: 33.00    Pack years: 16.50    Types: Cigarettes    Quit date: 04/23/2005    Years since quitting: 15.8   Smokeless tobacco: Never  Substance and Sexual Activity   Alcohol use: Yes    Comment: socially    Drug use: No   Sexual activity: Not on file  Other Topics Concern   Not on file  Social History Narrative   Not on file   Social Determinants of Health   Financial Resource Strain: Not on file  Food Insecurity: Not on file  Transportation Needs: Not on file  Physical Activity: Not on file  Stress: Not on file  Social Connections: Not on file  Intimate Partner Violence: Not on file    Allergies as of 02/20/2021       Reactions   Codeine Hives, Itching   Aspirin    unknown reaction, childhood allergy.   Hctz [hydrochlorothiazide]    ?pancreatitis    Erythromycin Rash   Penicillins Other (See Comments)   childhood allergy        Medication List        Accurate as of February 20, 2021  8:42 PM. If you have any questions, ask your nurse or doctor.          STOP taking these medications    neomycin-polymyxin-hydrocortisone OTIC solution Commonly known as: CORTISPORIN Stopped by: Kathlene November, MD       TAKE these medications    albuterol 108 (90 Base) MCG/ACT inhaler Commonly known as: VENTOLIN HFA Inhale 2 puffs into the lungs every 6 (six) hours as needed for wheezing. Reported on 08/23/2015   budesonide-formoterol 80-4.5 MCG/ACT inhaler Commonly known as: Symbicort Inhale 2 puffs into the lungs 2 (two) times daily.   Magnesium 500 MG Caps Take 1 capsule by mouth daily.   omeprazole 20 MG capsule Commonly known as: PRILOSEC Take 1 capsule (20 mg total) by mouth daily.   pravastatin 20 MG tablet Commonly known as: PRAVACHOL Take 1 tablet (20 mg total) by mouth at bedtime.   PROBIOTIC DAILY PO Take 1  tablet by mouth daily.   sertraline 50 MG tablet Commonly known as: ZOLOFT TAKE 1 TABLET BY MOUTH EVERY DAY   vitamin C 500 MG tablet Commonly known as: ASCORBIC ACID Take 500 mg by mouth daily.   zolpidem 10 MG tablet Commonly known as: AMBIEN Take 1 tablet (10 mg total) by mouth at bedtime as needed for sleep.           Objective:   Physical Exam BP 128/82 (BP Location: Left Arm, Patient Position: Sitting, Cuff Size: Small)   Pulse (!) 59   Temp 98.3 F (36.8 C) (Oral)   Resp 16   Ht 5\' 4"  (1.626 m)   Wt 177 lb 8 oz (80.5 kg)   SpO2 97%   BMI 30.47 kg/m  General: Well developed, NAD, BMI noted Neck: No  thyromegaly  HEENT:  Normocephalic . Face symmetric, atraumatic Lungs:  CTA B Normal respiratory effort, no intercostal retractions, no accessory muscle use. Heart: RRR,  no murmur.  Abdomen:  Not distended, soft, non-tender. No rebound or rigidity.   DM foot exam: No edema, good  pedal pulses, pinprick examination normal Skin: Exposed areas without rash. Not pale. Not jaundice Neurologic:  alert & oriented X3.  Speech normal, gait appropriate for age and unassisted Strength symmetric and appropriate for age.  Psych: Cognition and judgment appear intact.  Cooperative with normal attention span and concentration.  Behavior appropriate. No anxious or depressed appearing.     Assessment    Assessment DM a1c  7.1 (2014) Elevated BP   Dyslipidemia Anxiety, Insomnia  GERD Cough variant Asthma, seasonal  Pancreatitis 2014, d/t HCTZ? Dr. Collene Mares Shingles, facial, [phone]  PLAN: Here for CPX DM: Diet controlled, seems to be doing well, feet exam negative, checking labs Dyslipidemia: After she started Pravachol, LDL decreased to 107.  Recheck today. Anxiety, insomnia: On sertraline, Ambien as needed, some stress at work but managing well. Cough variant asthma: On as needed inhaler, use them very infrequently. RTC 6 months    This visit occurred during the SARS-CoV-2 public health emergency.  Safety protocols were in place, including screening questions prior to the visit, additional usage of staff PPE, and extensive cleaning of exam room while observing appropriate contact time as indicated for disinfecting solutions.

## 2021-02-20 NOTE — Patient Instructions (Addendum)
Recommend to proceed with the following vaccines at your pharmacy:  Covid #4 Flu shot this fall Shingles vaccination     GO TO THE LAB : Get the blood work     [place], [place] back for a checkup in 6 months    "Living will", "[place] of attorney": Advanced care planning  (If you already have a living will or healthcare power of attorney, please bring the copy to be scanned in your chart.)  Advance care planning is a process that supports adults in  understanding and sharing their preferences regarding future medical care.   The patient's preferences are recorded in documents called Advance Directives.    Advanced directives are completed (and can be modified at any time) while the patient is in full mental capacity.   The documentation should be available at all times to the patient, the family and the healthcare providers.  Bring in a copy to be scanned in your chart is an excellent idea and is recommended   This legal documents direct treatment decision making and/or appoint a surrogate to make the decision if the patient is not capable to do so.    Advance directives can be documented in many types of formats,  documents have names such as:  Lliving will  Durable power of attorney for healthcare (healthcare proxy or healthcare power of attorney)  Combined directives  Physician orders for life-sustaining treatment    More information at:  [email or web address]

## 2021-02-20 NOTE — Assessment & Plan Note (Signed)
Here for CPX DM: Diet controlled, seems to be doing well, feet exam negative, checking labs Dyslipidemia: After she started Pravachol, LDL decreased to 107.  Recheck today. Anxiety, insomnia: On sertraline, Ambien as needed, some stress at work but managing well. Cough variant asthma: On as needed inhaler, use them very infrequently. RTC 6 months

## 2021-04-06 ENCOUNTER — Other Ambulatory Visit: Payer: Self-pay | Admitting: Internal Medicine

## 2021-04-08 ENCOUNTER — Encounter: Payer: Self-pay | Admitting: Internal Medicine

## 2021-05-26 ENCOUNTER — Other Ambulatory Visit: Payer: Self-pay | Admitting: Internal Medicine

## 2021-07-28 ENCOUNTER — Other Ambulatory Visit: Payer: Self-pay | Admitting: Internal Medicine

## 2021-08-20 ENCOUNTER — Ambulatory Visit: Payer: BC Managed Care – PPO | Admitting: Internal Medicine

## 2021-08-27 ENCOUNTER — Encounter: Payer: Self-pay | Admitting: Internal Medicine

## 2021-08-27 ENCOUNTER — Ambulatory Visit: Payer: BC Managed Care – PPO | Admitting: Internal Medicine

## 2021-08-27 VITALS — BP 118/82 | HR 62 | Temp 98.1°F | Resp 16 | Ht 64.0 in | Wt 179.0 lb

## 2021-08-27 DIAGNOSIS — E119 Type 2 diabetes mellitus without complications: Secondary | ICD-10-CM

## 2021-08-27 DIAGNOSIS — E785 Hyperlipidemia, unspecified: Secondary | ICD-10-CM

## 2021-08-27 LAB — LIPID PANEL
Cholesterol: 206 mg/dL — ABNORMAL HIGH (ref 0–200)
HDL: 83.1 mg/dL (ref >39.00)
LDL Cholesterol: 105 mg/dL — ABNORMAL HIGH (ref 0–99)
NonHDL: 123.21
Total CHOL/HDL Ratio: 2
Triglycerides: 91 mg/dL (ref 0.0–149.0)
VLDL: 18.2 mg/dL (ref 0.0–40.0)

## 2021-08-27 LAB — HEMOGLOBIN A1C: Hgb A1c MFr Bld: 6.6 % — ABNORMAL HIGH (ref 4.6–6.5)

## 2021-08-27 NOTE — Patient Instructions (Signed)
Good to see you.  Seems like you are doing well ? ?GO TO THE LAB : Get the blood work   ? ? ?[place], [place] ?Come back for   a physical exam by September ?

## 2021-08-27 NOTE — Progress Notes (Signed)
? ?Subjective:  ? ? Patient ID: Janice Shaw, female    DOB: Feb 02, 1961, 61 y.o.   MRN: [id number] ? ?DOS:  08/27/2021 ?Type of visit - description: Routine visit ? ?Today we talk about diabetes, asthma, high cholesterol. ?Good med compliance without side effects. ?She is doing well with diet. ?Trying to eat healthy. ? ?Review of Systems ?See above  ? ?Past Medical History:  ?Diagnosis Date  ? Asthma   ? Diabetes ([place]) 02/14/2013  ? Dyslipidemia 02/14/2013  ? Elevated BP   ? took HCTZ  ? GERD (gastroesophageal reflux disease)   ? Osteochondroma   ? Left femur  ? Pancreatitis 02/14/2013  ? Due to HCTZ ? Saw Dr Collene Mares   ? ? ?Past Surgical History:  ?Procedure Laterality Date  ? KNEE ARTHROSCOPY Left 07/31/2019  ? PARTIAL HYSTERECTOMY  2009  ? no oophorectomy  ? removal of VIN    ? thrombosed hemorroid    ? ? ?Current Outpatient Medications  ?Medication Instructions  ? albuterol (VENTOLIN HFA) 108 (90 Base) MCG/ACT inhaler 2 puffs, Inhalation, Every 6 hours PRN, Reported on 08/23/2015  ? budesonide-formoterol (SYMBICORT) 80-4.5 MCG/ACT inhaler 2 puffs, Inhalation, 2 times daily  ? Magnesium 500 MG CAPS 1 capsule, Daily  ? omeprazole (PRILOSEC) 20 MG capsule TAKE 1 CAPSULE BY MOUTH EVERY DAY (NOT COVERED)  ? pravastatin (PRAVACHOL) 20 MG tablet TAKE 1 TABLET BY MOUTH EVERYDAY AT BEDTIME  ? Probiotic Product (PROBIOTIC DAILY PO) 1 tablet, Oral, Daily  ? sertraline (ZOLOFT) 50 MG tablet TAKE 1 TABLET BY MOUTH EVERY DAY  ? vitamin C (ASCORBIC ACID) 500 mg, Oral, Daily  ? zolpidem (AMBIEN) 10 mg, Oral, At bedtime PRN  ? ? ?   ?Objective:  ? Physical Exam ?BP 118/82 (BP Location: Left Arm, Patient Position: Sitting, Cuff Size: Small)   Pulse 62   Temp 98.1 ?F (36.7 ?C) (Oral)   Resp 16   Ht '5\' 4"'$  (1.626 m)   Wt 179 lb (81.2 kg)   SpO2 98%   BMI 30.73 kg/m?  ?General:   ?Well developed, NAD, BMI noted. ?HEENT:  ?Normocephalic . Face symmetric, atraumatic ?Lungs:  ?CTA B ?Normal respiratory effort, no intercostal  retractions, no accessory muscle use. ?Heart: RRR,  no murmur.  ?Lower extremities: no pretibial edema bilaterally  ?Skin: Not pale. Not jaundice ?Neurologic:  ?alert & oriented X3.  ?Speech normal, gait appropriate for age and unassisted ?Psych--  ?Cognition and judgment appear intact.  ?Cooperative with normal attention span and concentration.  ?Behavior appropriate. ?No anxious or depressed appearing.  ? ?   ?Assessment   ? ? Assessment ?DM a1c  7.1 (2014) ?Elevated BP   ?Dyslipidemia ?Anxiety, Insomnia  ?GERD ?Cough variant Asthma, seasonal  ?Pancreatitis 2014, d/t HCTZ? Dr. Collene Mares ?Shingles, facial, 07-2019 ? ?PLAN: ?DM: Last A1c 6.5.  Diet controlled, doing well with diet, try to stay active.  Check A1c ?High cholesterol, last LDL 103, close to her goal of 100 or less.  Currently on Pravachol 20 mg without any side effects.  Is doing well with diet, still there is some room for improvement.  We agreed to check a FLP and then decide whether or not she would like to take more Pravachol. ?Asthma: Well-controlled, hardly ever uses Symbicort ?Preventive care: ?COVID vaccines: UTD ?RTC 01-2022 CPX ? ?This visit occurred during the SARS-CoV-2 public health emergency.  Safety protocols were in place, including screening questions prior to the visit, additional usage of staff PPE, and extensive cleaning of exam  room while observing appropriate contact time as indicated for disinfecting solutions.  ? ?

## 2021-08-28 NOTE — Assessment & Plan Note (Signed)
DM: Last A1c 6.5.  Diet controlled, doing well with diet, try to stay active.  Check A1c ?High cholesterol, last LDL 103, close to her goal of 100 or less.  Currently on Pravachol 20 mg without any side effects.  Is doing well with diet, still there is some room for improvement.  We agreed to check a FLP and then decide whether or not she would like to take more Pravachol. ?Asthma: Well-controlled, hardly ever uses Symbicort ?Preventive care: ?COVID vaccines: UTD ?RTC 01-2022 CPX ?

## 2021-10-17 ENCOUNTER — Encounter: Payer: Self-pay | Admitting: Internal Medicine

## 2021-10-17 MED ORDER — PRAVASTATIN SODIUM 20 MG PO TABS
ORAL_TABLET | ORAL | 1 refills | Status: DC
Start: 2021-10-17 — End: 2022-04-10

## 2021-11-23 ENCOUNTER — Other Ambulatory Visit: Payer: Self-pay | Admitting: Internal Medicine

## 2022-01-08 ENCOUNTER — Encounter: Payer: Self-pay | Admitting: Internal Medicine

## 2022-01-26 ENCOUNTER — Other Ambulatory Visit: Payer: Self-pay | Admitting: Internal Medicine

## 2022-02-10 ENCOUNTER — Ambulatory Visit (INDEPENDENT_AMBULATORY_CARE_PROVIDER_SITE_OTHER): Payer: BC Managed Care – PPO | Admitting: Internal Medicine

## 2022-02-10 ENCOUNTER — Encounter: Payer: Self-pay | Admitting: Internal Medicine

## 2022-02-10 VITALS — BP 122/82 | HR 64 | Temp 98.2°F | Resp 16 | Ht 64.0 in | Wt 178.1 lb

## 2022-02-10 DIAGNOSIS — Z780 Asymptomatic menopausal state: Secondary | ICD-10-CM | POA: Diagnosis not present

## 2022-02-10 DIAGNOSIS — E119 Type 2 diabetes mellitus without complications: Secondary | ICD-10-CM

## 2022-02-10 DIAGNOSIS — E785 Hyperlipidemia, unspecified: Secondary | ICD-10-CM

## 2022-02-10 DIAGNOSIS — Z0000 Encounter for general adult medical examination without abnormal findings: Secondary | ICD-10-CM | POA: Diagnosis not present

## 2022-02-10 DIAGNOSIS — Z1231 Encounter for screening mammogram for malignant neoplasm of breast: Secondary | ICD-10-CM

## 2022-02-10 LAB — CBC WITH DIFFERENTIAL/PLATELET
Basophils Absolute: 0 10*3/uL (ref 0.0–0.1)
Basophils Relative: 0.5 % (ref 0.0–3.0)
Eosinophils Absolute: 0 10*3/uL (ref 0.0–0.7)
Eosinophils Relative: 0.4 % (ref 0.0–5.0)
HCT: 42.8 % (ref 36.0–46.0)
Hemoglobin: 14.2 g/dL (ref 12.0–15.0)
Lymphocytes Relative: 13.3 % (ref 12.0–46.0)
Lymphs Abs: 1.1 10*3/uL (ref 0.7–4.0)
MCHC: 33.1 g/dL (ref 30.0–36.0)
MCV: 91 fl (ref 78.0–100.0)
Monocytes Absolute: 0.4 10*3/uL (ref 0.1–1.0)
Monocytes Relative: 5.6 % (ref 3.0–12.0)
Neutro Abs: 6.4 10*3/uL (ref 1.4–7.7)
Neutrophils Relative %: 80.2 % — ABNORMAL HIGH (ref 43.0–77.0)
Platelets: 185 10*3/uL (ref 150.0–400.0)
RBC: 4.71 Mil/uL (ref 3.87–5.11)
RDW: 13.5 % (ref 11.5–15.5)
WBC: 8 10*3/uL (ref 4.0–10.5)

## 2022-02-10 LAB — COMPREHENSIVE METABOLIC PANEL
ALT: 10 U/L (ref 0–35)
AST: 19 U/L (ref 0–37)
Albumin: 4.6 g/dL (ref 3.5–5.2)
Alkaline Phosphatase: 63 U/L (ref 39–117)
BUN: 14 mg/dL (ref 6–23)
CO2: 30 mEq/L (ref 19–32)
Calcium: 10.2 mg/dL (ref 8.4–10.5)
Chloride: 101 mEq/L (ref 96–112)
Creatinine, Ser: 0.75 mg/dL (ref 0.40–1.20)
GFR: 84.75 mL/min (ref >60.00)
Glucose, Bld: 134 mg/dL — ABNORMAL HIGH (ref 70–99)
Potassium: 4.5 mEq/L (ref 3.5–5.1)
Sodium: 139 mEq/L (ref 135–145)
Total Bilirubin: 0.7 mg/dL (ref 0.2–1.2)
Total Protein: 7.2 g/dL (ref 6.0–8.3)

## 2022-02-10 LAB — LIPID PANEL
Cholesterol: 203 mg/dL — ABNORMAL HIGH (ref 0–200)
HDL: 84 mg/dL (ref >39.00)
LDL Cholesterol: 104 mg/dL — ABNORMAL HIGH (ref 0–99)
NonHDL: 118.95
Total CHOL/HDL Ratio: 2
Triglycerides: 73 mg/dL (ref 0.0–149.0)
VLDL: 14.6 mg/dL (ref 0.0–40.0)

## 2022-02-10 LAB — MICROALBUMIN / CREATININE URINE RATIO
Creatinine,U: 140.9 mg/dL
Microalb Creat Ratio: 1.8 mg/g (ref 0.0–30.0)
Microalb, Ur: 2.6 mg/dL — ABNORMAL HIGH (ref 0.0–1.9)

## 2022-02-10 LAB — HEMOGLOBIN A1C: Hgb A1c MFr Bld: 6.5 % (ref 4.6–6.5)

## 2022-02-10 MED ORDER — VITAMIN D3 25 MCG (1000 UT) PO CAPS: 2000.0000 [IU] | ORAL_CAPSULE | Freq: Every day | ORAL | Status: AC

## 2022-02-10 NOTE — Progress Notes (Unsigned)
Subjective:    Patient ID: Janice Shaw, female    DOB: 11-02-1960, 61 y.o.   MRN: [id number]  DOS:  02/10/2022 Type of visit - description: cpx Here for CPX No concerns  Review of Systems See above   Past Medical History:  Diagnosis Date   Asthma    Diabetes ([place]) 02/14/2013   Dyslipidemia 02/14/2013   Elevated BP    took HCTZ   GERD (gastroesophageal reflux disease)    Osteochondroma    Left femur   Pancreatitis 02/14/2013   Due to HCTZ ? Saw Dr Collene Mares     Past Surgical History:  Procedure Laterality Date   KNEE ARTHROSCOPY Left 07/31/2019   PARTIAL HYSTERECTOMY  2009   no oophorectomy   removal of VIN     thrombosed hemorroid      Current Outpatient Medications  Medication Instructions   albuterol (VENTOLIN HFA) 108 (90 Base) MCG/ACT inhaler 2 puffs, Inhalation, Every 6 hours PRN, Reported on 08/23/2015   ascorbic acid (VITAMIN C) 500 mg, Oral, Daily   budesonide-formoterol (SYMBICORT) 80-4.5 MCG/ACT inhaler 2 puffs, Inhalation, 2 times daily   Magnesium 500 MG CAPS 1 capsule, Daily   omeprazole (PRILOSEC) 20 mg, Oral, Daily   pravastatin (PRAVACHOL) 20 MG tablet TAKE 1 TABLET BY MOUTH EVERYDAY AT BEDTIME   Probiotic Product (PROBIOTIC DAILY PO) 1 tablet, Oral, Daily   sertraline (ZOLOFT) 50 MG tablet TAKE 1 TABLET BY MOUTH EVERY DAY   zolpidem (AMBIEN) 10 mg, Oral, At bedtime PRN       Objective:   Physical Exam BP 122/82   Pulse 64   Temp 98.2 F (36.8 C) (Oral)   Resp 16   Ht '5\' 4"'$  (1.626 m)   Wt 178 lb 2 oz (80.8 kg)   SpO2 98%   BMI 30.58 kg/m  General: Well developed, NAD, BMI noted Neck: No  thyromegaly  HEENT:  Normocephalic . Face symmetric, atraumatic Lungs:  CTA B Normal respiratory effort, no intercostal retractions, no accessory muscle use. Heart: RRR,  no murmur.  Abdomen:  Not distended, soft, non-tender. No rebound or rigidity.   Lower extremities: no pretibial edema bilaterally  Skin: Exposed areas without rash. Not pale.  Not jaundice Neurologic:  alert & oriented X3.  Speech normal, gait appropriate for age and unassisted Strength symmetric and appropriate for age.  Psych: Cognition and judgment appear intact.  Cooperative with normal attention span and concentration.  Behavior appropriate. No anxious or depressed appearing.     Assessment     Assessment DM a1c  7.1 (2014) Elevated BP   Dyslipidemia Anxiety, Insomnia  GERD Cough variant Asthma, seasonal  Pancreatitis 2014, d/t HCTZ? Dr. Collene Mares Shingles, facial, [phone]  PLAN: Here for CPX DM: Diet controlled, doing well.  Check A1c Dyslipidemia: On statins, last LDL 105, close to goal (100.  Recheck Anxiety insomnia: On sertraline, has not taken Ambien in a while, removed from list.  Symptoms controlled Cough variant asthma: Rarely has symptoms, on meds as needed RTC 6 months  -Tdap 2015 - PNA 23 2016, prevnar 2017 - shingrix discussed, declined for now  . -Covid booster : plans to proceed  - RSV vax d/w pt  -Rec flu shot q. year -Female care:  s/p hyst, still has ovaries, no h/o abnormal PAPs,  rec to see gyn q 3 years, she is reluctant. MMG 02-04-21 ([facility]), order entered. No previous DEXA, ordering one  -CCS: Patient reported 2009- polyps removed, cscope done again 07/2017: next per  GI;  Dr Collene Mares -Diet-exercise: doing well, walks daily. - Labs: CMP FLP CBC A1c micro --H POA discussed

## 2022-02-10 NOTE — Patient Instructions (Addendum)
Vaccines I recommend:  Covid booster RSV vaccine Flu shot Shingrix (shingles)  Please think about getting a healthcare power of attorney  Start vitamin D 3 over-the-counter: 2000 units daily  GO TO THE LAB : Get the blood work     [place], [place] back for a checkup in 6 months      STOP BY THE FIRST FLOOR: Schedule a mammogram and bone density test    Per our records you are due for your diabetic eye exam. Please contact your eye doctor to schedule an appointment. Please have them send copies of your office visit notes to [place]. Our fax number is [phone]. If you need a referral to an eye doctor please let us know.

## 2022-02-11 ENCOUNTER — Encounter: Payer: Self-pay | Admitting: Internal Medicine

## 2022-02-11 NOTE — Assessment & Plan Note (Signed)
Here for CPX DM: Diet controlled, doing well.  Check A1c Dyslipidemia: On statins, last LDL 105, close to goal (100).  Recheck Anxiety insomnia: On sertraline, has not taken Ambien in a while, removed from list.  Symptoms controlled Cough variant asthma: Rarely has symptoms, on meds as needed RTC 6 months

## 2022-02-11 NOTE — Assessment & Plan Note (Signed)
-  Tdap 2015 - PNA 23 2016, prevnar 2017 - shingrix discussed, declined for now  . -Covid booster : plans to proceed  - RSV vax d/w pt  -Rec flu shot q. year -Female care:  s/p hyst, still has ovaries, no h/o abnormal PAPs,  rec to see gyn q 3 years, she is reluctant. MMG 02-04-21 ([facility]), order entered. No previous DEXA, ordering one  -CCS: Patient reported 2009- polyps removed, cscope done again 07/2017: next per GI;  Dr Collene Mares -Diet-exercise: doing well, walks daily. - Labs: CMP FLP CBC A1c micro --H POA discussed

## 2022-02-24 ENCOUNTER — Other Ambulatory Visit (HOSPITAL_BASED_OUTPATIENT_CLINIC_OR_DEPARTMENT_OTHER): Payer: BC Managed Care – PPO

## 2022-02-24 ENCOUNTER — Ambulatory Visit (HOSPITAL_BASED_OUTPATIENT_CLINIC_OR_DEPARTMENT_OTHER): Payer: BC Managed Care – PPO

## 2022-03-02 ENCOUNTER — Telehealth: Payer: Self-pay | Admitting: Internal Medicine

## 2022-03-02 NOTE — Telephone Encounter (Signed)
Send a prescription for sertraline 100 mg 1 pill daily, 6 months  ======= I spoke with the patient today. Her husband has been admitted to the  psychiatry hospital, d/t a manic episode and psychosis. She is certainly very anxious and stressed. Listening therapy provided. We agreed to increase sertraline from  50 mg to 75 g for a week then 100 mg. She knows to reach out if she needs more help.

## 2022-03-03 MED ORDER — SERTRALINE HCL 100 MG PO TABS: 100.0000 mg | ORAL_TABLET | Freq: Every day | ORAL | 1 refills | Status: DC

## 2022-03-03 NOTE — Telephone Encounter (Signed)
Rx sent 

## 2022-03-23 ENCOUNTER — Other Ambulatory Visit (HOSPITAL_BASED_OUTPATIENT_CLINIC_OR_DEPARTMENT_OTHER): Payer: BC Managed Care – PPO

## 2022-03-23 ENCOUNTER — Inpatient Hospital Stay (HOSPITAL_BASED_OUTPATIENT_CLINIC_OR_DEPARTMENT_OTHER): Admission: RE | Admit: 2022-03-23 | Payer: BC Managed Care – PPO | Source: Ambulatory Visit

## 2022-04-06 ENCOUNTER — Encounter (HOSPITAL_BASED_OUTPATIENT_CLINIC_OR_DEPARTMENT_OTHER): Payer: Self-pay

## 2022-04-06 ENCOUNTER — Ambulatory Visit (HOSPITAL_BASED_OUTPATIENT_CLINIC_OR_DEPARTMENT_OTHER)
Admission: RE | Admit: 2022-04-06 | Discharge: 2022-04-06 | Disposition: A | Discharge status: Home / Self Care | Payer: 59 | Source: Ambulatory Visit | Attending: Internal Medicine | Admitting: Internal Medicine

## 2022-04-06 DIAGNOSIS — Z780 Asymptomatic menopausal state: Secondary | ICD-10-CM | POA: Insufficient documentation

## 2022-04-06 DIAGNOSIS — Z1231 Encounter for screening mammogram for malignant neoplasm of breast: Secondary | ICD-10-CM | POA: Diagnosis present

## 2022-04-10 ENCOUNTER — Other Ambulatory Visit: Payer: Self-pay | Admitting: Internal Medicine

## 2022-06-01 ENCOUNTER — Other Ambulatory Visit: Payer: Self-pay | Admitting: Internal Medicine

## 2022-07-02 ENCOUNTER — Encounter: Payer: Self-pay | Admitting: Internal Medicine

## 2022-08-11 ENCOUNTER — Ambulatory Visit: Payer: 59 | Admitting: Internal Medicine

## 2022-08-28 ENCOUNTER — Ambulatory Visit: Payer: 59 | Admitting: Internal Medicine

## 2022-08-28 ENCOUNTER — Encounter: Payer: Self-pay | Admitting: Internal Medicine

## 2022-08-28 VITALS — BP 128/76 | HR 59 | Temp 97.6°F | Resp 16 | Ht 64.0 in | Wt 186.1 lb

## 2022-08-28 DIAGNOSIS — E785 Hyperlipidemia, unspecified: Secondary | ICD-10-CM

## 2022-08-28 DIAGNOSIS — F419 Anxiety disorder, unspecified: Secondary | ICD-10-CM

## 2022-08-28 DIAGNOSIS — E119 Type 2 diabetes mellitus without complications: Secondary | ICD-10-CM | POA: Diagnosis not present

## 2022-08-28 LAB — BASIC METABOLIC PANEL
BUN: 16 mg/dL (ref 6–23)
CO2: 29 mEq/L (ref 19–32)
Calcium: 10 mg/dL (ref 8.4–10.5)
Chloride: 101 mEq/L (ref 96–112)
Creatinine, Ser: 0.75 mg/dL (ref 0.40–1.20)
GFR: 84.43 mL/min (ref >60.00)
Glucose, Bld: 139 mg/dL — ABNORMAL HIGH (ref 70–99)
Potassium: 4.8 mEq/L (ref 3.5–5.1)
Sodium: 137 mEq/L (ref 135–145)

## 2022-08-28 LAB — HEMOGLOBIN A1C: Hgb A1c MFr Bld: 6.6 % — ABNORMAL HIGH (ref 4.6–6.5)

## 2022-08-28 NOTE — Patient Instructions (Addendum)
   GO TO THE LAB : Get the blood work     GO TO THE FRONT DESK, PLEASE SCHEDULE YOUR APPOINTMENTS Come back for   a physical exam in 5 months    Vaccines I recommend: Covid booster Shingrix (shingles)  Per our records you are due for your diabetic eye exam. Please contact your eye doctor to schedule an appointment. Please have them send copies of your office visit notes to [place]. Our fax number is [phone]. If you need a referral to an eye doctor please let us know.

## 2022-08-28 NOTE — Progress Notes (Unsigned)
   Subjective:    Patient ID: Janice Shaw, female    DOB: [birth year], 62 y.o.   MRN: [id number]  DOS:  08/28/2022 Type of visit - description: Follow-up  All chronic medical problems were reviewed. Has been under a lot of stress related to her husband's health, they are now separated. Denies any lower extremity paresthesias Review of Systems See above   Past Medical History:  Diagnosis Date   Asthma    Diabetes 02/14/2013   Dyslipidemia 02/14/2013   Elevated BP    took HCTZ   GERD (gastroesophageal reflux disease)    Osteochondroma    Left femur   Pancreatitis 02/14/2013   Due to HCTZ ? Saw Dr Loreta Ave     Past Surgical History:  Procedure Laterality Date   KNEE ARTHROSCOPY Left 07/31/2019   PARTIAL HYSTERECTOMY  2009   no oophorectomy   removal of VIN     thrombosed hemorroid      Current Outpatient Medications  Medication Instructions   albuterol (VENTOLIN HFA) 108 (90 Base) MCG/ACT inhaler 2 puffs, Inhalation, Every 6 hours PRN, Reported on 08/23/2015   ascorbic acid (VITAMIN C) 500 mg, Oral, Daily   budesonide-formoterol (SYMBICORT) 80-4.5 MCG/ACT inhaler 2 puffs, Inhalation, 2 times daily   Magnesium 500 MG CAPS 1 capsule, Daily   omeprazole (PRILOSEC) 20 mg, Oral, Daily   pravastatin (PRAVACHOL) 20 mg, Oral, Daily at bedtime   Probiotic Product (PROBIOTIC DAILY PO) 1 tablet, Oral, Daily   sertraline (ZOLOFT) 100 mg, Oral, Daily   Vitamin D3 2,000 Units, Oral, Daily       Objective:   Physical Exam BP 128/76   Pulse (!) 59   Temp 97.6 F (36.4 C) (Oral)   Resp 16   Ht 5\' 4"  (1.626 m)   Wt 186 lb 2 oz (84.4 kg)   SpO2 96%   BMI 31.95 kg/m  General:   Well developed, NAD, BMI noted. HEENT:  Normocephalic . Face symmetric, atraumatic Lungs:  CTA B Normal respiratory effort, no intercostal retractions, no accessory muscle use. Heart: RRR,  no murmur.  DM foot exam: No edema, good pedal pulses, pinprick examination normal Skin: Not pale. Not  jaundice Neurologic:  alert & oriented X3.  Speech normal, gait appropriate for age and unassisted Psych--  Cognition and judgment appear intact.  Cooperative with normal attention span and concentration.  Behavior appropriate. No anxious or depressed appearing.      Assessment     Assessment DM a1c  7.1 (2014) Elevated BP   Dyslipidemia Anxiety, Insomnia  GERD Cough variant Asthma, seasonal  Pancreatitis 2014, d/t HCTZ? Dr. Loreta Ave Shingles, facial, [phone]  PLAN: DM: Diet controlled, check a BMP and A1c.  Feet exam negative Dyslipidemia: On Pravachol, well-controlled per last FLP Anxiety insomnia: On sertraline.  Had a lot of stress in the last few months, her husband has serious mental issues, they are now separated, [place] is living out of state. Fortunately she is doing okay emotionally. Preventive care: Vaccine advice provided. RTC 01-2019 for CPX  9-19 Here for CPX DM: Diet controlled, doing well.  Check A1c Dyslipidemia: On statins, last LDL 105, close to goal (100).  Recheck Anxiety insomnia: On sertraline, has not taken Ambien in a while, removed from list.  Symptoms controlled Cough variant asthma: Rarely has symptoms, on meds as needed RTC 6 months

## 2022-08-29 NOTE — Assessment & Plan Note (Signed)
DM: Diet controlled, check a BMP and A1c.  Feet exam negative Dyslipidemia: On Pravachol, well-controlled per last FLP Anxiety insomnia: On sertraline.  Had a lot of stress in the last few months, her husband has serious mental issues, they are now separated, he is living out of state. Fortunately she is doing okay emotionally. Preventive care: Vaccine advice provided. RTC 01-2023 for CPX

## 2022-08-31 ENCOUNTER — Other Ambulatory Visit: Payer: Self-pay | Admitting: Internal Medicine

## 2022-11-13 ENCOUNTER — Other Ambulatory Visit: Payer: Self-pay | Admitting: Internal Medicine

## 2022-11-29 ENCOUNTER — Other Ambulatory Visit: Payer: Self-pay | Admitting: Internal Medicine

## 2022-12-14 LAB — HM DIABETES EYE EXAM

## 2023-02-16 ENCOUNTER — Encounter: Payer: 59 | Admitting: Internal Medicine

## 2023-03-07 ENCOUNTER — Other Ambulatory Visit: Payer: Self-pay | Admitting: Internal Medicine

## 2023-03-08 LAB — HM COLONOSCOPY

## 2023-03-28 ENCOUNTER — Encounter: Payer: Self-pay | Admitting: Internal Medicine

## 2023-03-29 ENCOUNTER — Telehealth (INDEPENDENT_AMBULATORY_CARE_PROVIDER_SITE_OTHER): Payer: Self-pay | Admitting: Internal Medicine

## 2023-03-29 ENCOUNTER — Encounter: Payer: Self-pay | Admitting: Internal Medicine

## 2023-03-29 VITALS — Ht 64.0 in

## 2023-03-29 DIAGNOSIS — F4321 Adjustment disorder with depressed mood: Secondary | ICD-10-CM

## 2023-03-29 DIAGNOSIS — F419 Anxiety disorder, unspecified: Secondary | ICD-10-CM

## 2023-03-29 MED ORDER — ALPRAZOLAM 0.5 MG PO TABS: 0.2500 mg | ORAL_TABLET | Freq: Every evening | ORAL | 0 refills | Status: DC | PRN

## 2023-03-29 NOTE — Progress Notes (Signed)
Subjective:    Patient ID: Janice Shaw, female    DOB: 1960-06-19, 62 y.o.   MRN: [id number]  DOS:  03/29/2023 Type of visit - description: Virtual Visit via Video Note  I connected with the above patient  by a video enabled telemedicine application and verified that I am speaking with the correct person using two identifiers.   Location of patient: home  Location of provider: office  Persons participating in the virtual visit: patient, provider   I discussed the limitations of evaluation and management by telemedicine and the availability of in person appointments. The patient expressed understanding and agreed to proceed.  Acute: Last month, loss her husband of 33 years to apparently a aortic aneurysm. Janice Shaw had a lot of mental issues. History of suicidal attempts Was hospitalized for a while. The last year he has been living in [place] with his mother. Janice Shaw is obviously affected by these. Denies feeling guilty, no suicidal ideas.  Does have occasional difficulty sleeping.    Review of Systems See above   Past Medical History:  Diagnosis Date   Asthma    Diabetes ([facility]) 02/14/2013   Dyslipidemia 02/14/2013   Elevated BP    took HCTZ   GERD (gastroesophageal reflux disease)    Osteochondroma    Left femur   Pancreatitis 02/14/2013   Due to HCTZ ? Saw Dr Loreta Ave     Past Surgical History:  Procedure Laterality Date   KNEE ARTHROSCOPY Left 07/31/2019   PARTIAL HYSTERECTOMY  2009   no oophorectomy   removal of VIN     thrombosed hemorroid      Current Outpatient Medications  Medication Instructions   albuterol (VENTOLIN HFA) 108 (90 Base) MCG/ACT inhaler 2 puffs, Inhalation, Every 6 hours PRN, Reported on 08/23/2015   ascorbic acid (VITAMIN C) 500 mg, Oral, Daily   budesonide-formoterol (SYMBICORT) 80-4.5 MCG/ACT inhaler 2 puffs, Inhalation, 2 times daily   Magnesium 500 MG CAPS 1 capsule, Daily   omeprazole (PRILOSEC) 20 mg, Oral, Daily   pravastatin  (PRAVACHOL) 20 mg, Oral, Daily at bedtime   Probiotic Product (PROBIOTIC DAILY PO) 1 tablet, Oral, Daily   sertraline (ZOLOFT) 100 mg, Oral, Daily   Vitamin D3 2,000 Units, Oral, Daily       Objective:   Physical Exam Ht 5\' 4"  (1.626 m)   BMI 31.95 kg/m  This is a virtual video visit, alert oriented x 3.  She is tearful during parts of the interview      Assessment    Assessment DM a1c  7.1 (2014) Elevated BP   Dyslipidemia Anxiety, Insomnia  GERD Cough variant Asthma, seasonal  Pancreatitis 2014, d/t HCTZ? Dr. Loreta Ave Shingles, facial, 07-2019  PLAN: Grieving, history of anxiety and insomnia: Lost her husband few days ago, they were living separately, he was in [place] living with his mother.  He had a long history of mental illnesses. Janice Shaw is having hard time since then, fortunately no suicidal ideas, she knows this is a process and is hopeful that she will be okay.  She just needs some help dealing with this issue. Extensive listening therapy provided;  for difficulty sleeping I sent a prescription for Xanax, she has taken it before with good results, aware of risk of abuse. I also strongly recommended to see a counselor, list of providers sent. She is currently on Zoloft, we agreed to stay on the same dose of 100 mg daily, increase it if needed. RTC 2 months  I discussed the assessment and treatment plan with the patient. The patient was provided an opportunity to ask questions and all were answered. The patient agreed with the plan and demonstrated an understanding of the instructions.   The patient was advised to call back or seek an in-person evaluation if the symptoms worsen or if the condition fails to improve as anticipated.

## 2023-03-29 NOTE — Assessment & Plan Note (Signed)
Grieving, history of anxiety and insomnia: Lost her husband few days ago, they were living separately, he was in [place] living with his mother.  He had a long history of mental illnesses. Janice Shaw is having hard time since then, fortunately no suicidal ideas, she knows this is a process and is hopeful that she will be okay.  She just needs some help dealing with this issue. Extensive listening therapy provided;  for difficulty sleeping I sent a prescription for Xanax, she has taken it before with good results, aware of risk of abuse. I also strongly recommended to see a counselor, list of providers sent. She is currently on Zoloft, we agreed to stay on the same dose of 100 mg daily, increase it if needed. RTC 2 months

## 2023-05-11 ENCOUNTER — Encounter: Payer: Self-pay | Admitting: Internal Medicine

## 2023-05-11 NOTE — Telephone Encounter (Signed)
 Care team updated and letter sent for eye exam notes.

## 2023-05-14 ENCOUNTER — Encounter: Payer: Self-pay | Admitting: Internal Medicine

## 2023-05-29 ENCOUNTER — Other Ambulatory Visit: Payer: Self-pay | Admitting: Internal Medicine

## 2023-06-02 ENCOUNTER — Other Ambulatory Visit: Payer: Self-pay | Admitting: Internal Medicine

## 2023-06-02 DIAGNOSIS — Z1231 Encounter for screening mammogram for malignant neoplasm of breast: Secondary | ICD-10-CM

## 2023-06-22 ENCOUNTER — Ambulatory Visit
Admission: RE | Admit: 2023-06-22 | Discharge: 2023-06-22 | Disposition: A | Discharge status: Home / Self Care | Payer: BC Managed Care – PPO | Source: Ambulatory Visit

## 2023-06-22 DIAGNOSIS — Z1231 Encounter for screening mammogram for malignant neoplasm of breast: Secondary | ICD-10-CM

## 2023-06-23 ENCOUNTER — Other Ambulatory Visit: Payer: Self-pay | Admitting: Internal Medicine

## 2023-06-29 DIAGNOSIS — L821 Other seborrheic keratosis: Secondary | ICD-10-CM | POA: Diagnosis not present

## 2023-06-29 DIAGNOSIS — L304 Erythema intertrigo: Secondary | ICD-10-CM | POA: Diagnosis not present

## 2023-06-29 DIAGNOSIS — L814 Other melanin hyperpigmentation: Secondary | ICD-10-CM | POA: Diagnosis not present

## 2023-06-29 DIAGNOSIS — D225 Melanocytic nevi of trunk: Secondary | ICD-10-CM | POA: Diagnosis not present

## 2023-06-30 ENCOUNTER — Ambulatory Visit: Payer: BC Managed Care – PPO | Admitting: Internal Medicine

## 2023-06-30 ENCOUNTER — Encounter: Payer: Self-pay | Admitting: Internal Medicine

## 2023-06-30 VITALS — BP 118/64 | HR 71 | Temp 98.0°F | Resp 16 | Ht 64.0 in | Wt 201.4 lb

## 2023-06-30 DIAGNOSIS — F419 Anxiety disorder, unspecified: Secondary | ICD-10-CM

## 2023-06-30 DIAGNOSIS — E119 Type 2 diabetes mellitus without complications: Secondary | ICD-10-CM

## 2023-06-30 DIAGNOSIS — E785 Hyperlipidemia, unspecified: Secondary | ICD-10-CM | POA: Diagnosis not present

## 2023-06-30 DIAGNOSIS — F4321 Adjustment disorder with depressed mood: Secondary | ICD-10-CM | POA: Diagnosis not present

## 2023-06-30 DIAGNOSIS — Z79899 Other long term (current) drug therapy: Secondary | ICD-10-CM | POA: Diagnosis not present

## 2023-06-30 DIAGNOSIS — Z23 Encounter for immunization: Secondary | ICD-10-CM | POA: Diagnosis not present

## 2023-06-30 LAB — LIPID PANEL
Cholesterol: 209 mg/dL — ABNORMAL HIGH (ref 0–200)
HDL: 69.9 mg/dL (ref >39.00)
LDL Cholesterol: 118 mg/dL — ABNORMAL HIGH (ref 0–99)
NonHDL: 139.43
Total CHOL/HDL Ratio: 3
Triglycerides: 105 mg/dL (ref 0.0–149.0)
VLDL: 21 mg/dL (ref 0.0–40.0)

## 2023-06-30 LAB — HEMOGLOBIN A1C: Hgb A1c MFr Bld: 7 % — ABNORMAL HIGH (ref 4.6–6.5)

## 2023-06-30 LAB — BASIC METABOLIC PANEL
BUN: 18 mg/dL (ref 6–23)
CO2: 28 meq/L (ref 19–32)
Calcium: 9.4 mg/dL (ref 8.4–10.5)
Chloride: 101 meq/L (ref 96–112)
Creatinine, Ser: 0.68 mg/dL (ref 0.40–1.20)
GFR: 91.81 mL/min (ref >60.00)
Glucose, Bld: 136 mg/dL — ABNORMAL HIGH (ref 70–99)
Potassium: 4.9 meq/L (ref 3.5–5.1)
Sodium: 138 meq/L (ref 135–145)

## 2023-06-30 LAB — MICROALBUMIN / CREATININE URINE RATIO
Creatinine,U: 70.7 mg/dL
Microalb Creat Ratio: 1.1 mg/g (ref 0.0–30.0)
Microalb, Ur: 0.8 mg/dL (ref 0.0–1.9)

## 2023-06-30 LAB — AST: AST: 20 U/L (ref 0–37)

## 2023-06-30 LAB — ALT: ALT: 13 U/L (ref 0–35)

## 2023-06-30 NOTE — Patient Instructions (Addendum)
 You can send your healthcare power of attorney to be followed your chart  GO TO THE LAB : Get the blood work     Next visit with me in 4 months for a physical exam Please schedule it at the front desk  HEALTHY SLEEP Sleep hygiene: Basic rules for a good night's sleep  Sleep only as much as you need to feel rested and then get out of bed  Keep a regular sleep schedule  Avoid forcing sleep  Exercise regularly for at least 20 minutes, preferably 4 to 5 hours before bedtime  Avoid caffeinated beverages after lunch  Avoid alcohol near bedtime: no night cap  Avoid smoking, especially in the evening  Do not go to bed hungry  Adjust bedroom environment  Avoid prolonged use of light-emitting screens before bedtime   Deal with your worries before bedtime

## 2023-06-30 NOTE — Progress Notes (Signed)
 Subjective:    Patient ID: Janice Shaw, female    DOB: [birth year], 63 y.o.   MRN: [id number]  DOS:  06/30/2023 Type of visit - description: Follow-up  Follow-up from previous visit, chronic medical problems addressed. Lost her husband several months ago, still going through the grieving process.  Good days and bad days, no persistent depression. Some difficulty sleeping.   Wt Readings from Last 3 Encounters:  06/30/23 201 lb 6 oz (91.3 kg)  08/28/22 186 lb 2 oz (84.4 kg)  02/10/22 178 lb 2 oz (80.8 kg)     Review of Systems See above   Past Medical History:  Diagnosis Date   Asthma    Diabetes ([facility]) 02/14/2013   Dyslipidemia 02/14/2013   Elevated BP    took HCTZ   GERD (gastroesophageal reflux disease)    Osteochondroma    Left femur   Pancreatitis 02/14/2013   Due to HCTZ ? Saw Dr Kristie     Past Surgical History:  Procedure Laterality Date   KNEE ARTHROSCOPY Left 07/31/2019   PARTIAL HYSTERECTOMY  2009   no oophorectomy   removal of VIN     thrombosed hemorroid      Current Outpatient Medications  Medication Instructions   albuterol  (VENTOLIN  HFA) 108 (90 Base) MCG/ACT inhaler 2 puffs, Every 6 hours PRN   ALPRAZolam  (XANAX ) 0.25-0.5 mg, Oral, At bedtime PRN   ascorbic acid (VITAMIN C) 500 mg, Daily   budesonide -formoterol  (SYMBICORT ) 80-4.5 MCG/ACT inhaler 2 puffs, Inhalation, 2 times daily   Magnesium 500 MG CAPS 1 capsule, Daily   omeprazole  (PRILOSEC) 20 mg, Oral, Daily   pravastatin  (PRAVACHOL ) 20 mg, Oral, Daily at bedtime   Probiotic Product (PROBIOTIC DAILY PO) 1 tablet, Daily   sertraline  (ZOLOFT ) 100 mg, Oral, Daily   Vitamin D3 2,000 Units, Oral, Daily       Objective:   Physical Exam BP 118/64   Pulse 71   Temp 98 F (36.7 C) (Oral)   Resp 16   Ht 5' 4 (1.626 m)   Wt 201 lb 6 oz (91.3 kg)   SpO2 97%   BMI 34.57 kg/m  General:   Well developed, NAD, BMI noted. HEENT:  Normocephalic . Face symmetric, atraumatic Lungs:  CTA  B Normal respiratory effort, no intercostal retractions, no accessory muscle use. Heart: RRR,  no murmur.  DM foot exam: No edema, good pedal pulses, pinprick examination normal Skin: Not pale. Not jaundice Neurologic:  alert & oriented X3.  Speech normal, gait appropriate for age and unassisted Psych--  Cognition and judgment appear intact.  Cooperative with normal attention span and concentration.  Behavior appropriate. No anxious or depressed appearing.      Assessment     Assessment DM a1c  7.1 (2014) Elevated BP   Dyslipidemia Anxiety, Insomnia  GERD Cough variant Asthma, seasonal  Pancreatitis 2014, d/t HCTZ? Dr. Kristie Shingles, facial, [phone]  PLAN: Grieving: lost her husband, doing  okay with good days and bad days.  No persistent depression.  She takes sertraline  and  Xanax  as needed, does not think she needs more meds. Some difficulty sleeping, recommended good sleep habits and consider melatonin.  Also encouraged to think about a counselor. Anxiety insomnia: See above.  UDS and contract today DM: Diet controlled, check A1c, feet exam negative. Dyslipidemia: On pravastatin , check FLP AST ALT Preventive care reviewed: Had a flu shot-RSV-Covid late 2024 Tdap today MMG 05-2023 Had a Cscope 10/204 DEXA WNL 03-2022 RTC 4 months CPX

## 2023-07-01 NOTE — Assessment & Plan Note (Signed)
 Grieving: lost her husband, doing  okay with good days and bad days.  No persistent depression.  She takes sertraline  and  Xanax  as needed, does not think she needs more meds. Some difficulty sleeping, recommended good sleep habits and consider melatonin.  Also encouraged to think about a counselor. Anxiety insomnia: See above.  UDS and contract today DM: Diet controlled, check A1c, feet exam negative. Dyslipidemia: On pravastatin , check FLP AST ALT Preventive care reviewed:   RTC 4 months CPX

## 2023-07-01 NOTE — Assessment & Plan Note (Signed)
 Preventive care reviewed: Had a flu shot-RSV-Covid late 2024 Tdap today MMG 05-2023 Had a Cscope 10/204 DEXA WNL 03-2022 RTC 4 months CPX

## 2023-07-02 ENCOUNTER — Encounter: Payer: Self-pay | Admitting: Internal Medicine

## 2023-07-02 LAB — DRUG MONITORING PANEL 375977 , URINE
Alcohol Metabolites: POSITIVE ng/mL — AB (ref <500)
Amphetamines: NEGATIVE ng/mL (ref <500)
Barbiturates: NEGATIVE ng/mL (ref <300)
Benzodiazepines: NEGATIVE ng/mL (ref <100)
Cocaine Metabolite: NEGATIVE ng/mL (ref <150)
Desmethyltramadol: NEGATIVE ng/mL (ref <100)
Ethyl Glucuronide (ETG): 29653 ng/mL — ABNORMAL HIGH (ref <500)
Ethyl Sulfate (ETS): 5631 ng/mL — ABNORMAL HIGH (ref <100)
Marijuana Metabolite: 764 ng/mL — ABNORMAL HIGH (ref <5)
Marijuana Metabolite: POSITIVE ng/mL — AB (ref <20)
Opiates: NEGATIVE ng/mL (ref <100)
Oxycodone: NEGATIVE ng/mL (ref <100)
Tramadol: NEGATIVE ng/mL (ref <100)

## 2023-07-02 LAB — DM TEMPLATE

## 2023-07-02 MED ORDER — PRAVASTATIN SODIUM 40 MG PO TABS: 40.0000 mg | ORAL_TABLET | Freq: Every day | ORAL | 1 refills | Status: DC

## 2023-07-02 NOTE — Addendum Note (Signed)
 Addended by: Tyquasia Pant D on: 07/02/2023 12:48 PM   Modules accepted: Orders

## 2023-07-29 ENCOUNTER — Other Ambulatory Visit: Payer: Self-pay | Admitting: Internal Medicine

## 2023-08-24 DIAGNOSIS — L2989 Other pruritus: Secondary | ICD-10-CM | POA: Diagnosis not present

## 2023-08-24 DIAGNOSIS — L239 Allergic contact dermatitis, unspecified cause: Secondary | ICD-10-CM | POA: Diagnosis not present

## 2023-09-06 ENCOUNTER — Encounter: Payer: Self-pay | Admitting: Podiatry

## 2023-09-06 ENCOUNTER — Ambulatory Visit: Admitting: Podiatry

## 2023-09-06 DIAGNOSIS — L600 Ingrowing nail: Secondary | ICD-10-CM

## 2023-09-06 NOTE — Patient Instructions (Signed)

## 2023-09-08 NOTE — Progress Notes (Signed)
 Subjective:   Patient ID: Janice Shaw, female   DOB: 63 y.o.   MRN: [id number]   HPI Patient presents with an ingrown toenail of the left big toe medial border.  There is a spicule that is irritative and hard for her to wear shoe gear with comfortably with the patient not smoking currently and likes to be active   Review of Systems  All other systems reviewed and are negative.       Objective:  Physical Exam Vitals and nursing note reviewed.  Constitutional:      Appearance: She is well-developed.  Pulmonary:     Effort: Pulmonary effort is normal.  Musculoskeletal:        General: Normal range of motion.  Skin:    General: Skin is warm.  Neurological:     Mental Status: She is alert.     Neurovascular status intact muscle strength found to be adequate range of motion adequate with incurvated painful left hallux medial border with spicule of nail that is very tender when pressed making shoe gear difficult.  Good digital perfusion well-oriented     Assessment:  Ingrown toenail deformity left hallux medial border with pain     Plan:  H&P reviewed recommended correction of deformity and explained surgery to patient.  Patient was given consent form read it over understands risk signed consent form and I infiltrated the left big toe 60 mg like Marcaine mixture sterile prep done using sterile instrumentation remove the medial border spicule and exposed matrix applied phenol 3 applications 30 seconds followed by alcohol lavage sterile dressing gave instructions on soaks wear dressing 24 hours take it off earlier if throbbing were to occur

## 2023-09-30 ENCOUNTER — Other Ambulatory Visit: Payer: Self-pay | Admitting: Internal Medicine

## 2023-09-30 ENCOUNTER — Encounter: Payer: Self-pay | Admitting: Internal Medicine

## 2023-10-01 MED ORDER — SERTRALINE HCL 100 MG PO TABS: 100.0000 mg | ORAL_TABLET | Freq: Every day | ORAL | 1 refills | Status: DC

## 2023-10-04 DIAGNOSIS — F902 Attention-deficit hyperactivity disorder, combined type: Secondary | ICD-10-CM | POA: Diagnosis not present

## 2023-10-04 DIAGNOSIS — F4310 Post-traumatic stress disorder, unspecified: Secondary | ICD-10-CM | POA: Diagnosis not present

## 2023-10-14 DIAGNOSIS — F4310 Post-traumatic stress disorder, unspecified: Secondary | ICD-10-CM | POA: Diagnosis not present

## 2023-10-14 DIAGNOSIS — F902 Attention-deficit hyperactivity disorder, combined type: Secondary | ICD-10-CM | POA: Diagnosis not present

## 2023-10-20 DIAGNOSIS — F902 Attention-deficit hyperactivity disorder, combined type: Secondary | ICD-10-CM | POA: Diagnosis not present

## 2023-10-20 DIAGNOSIS — F4310 Post-traumatic stress disorder, unspecified: Secondary | ICD-10-CM | POA: Diagnosis not present

## 2023-10-27 DIAGNOSIS — F902 Attention-deficit hyperactivity disorder, combined type: Secondary | ICD-10-CM | POA: Diagnosis not present

## 2023-10-27 DIAGNOSIS — F4310 Post-traumatic stress disorder, unspecified: Secondary | ICD-10-CM | POA: Diagnosis not present

## 2023-11-01 DIAGNOSIS — F902 Attention-deficit hyperactivity disorder, combined type: Secondary | ICD-10-CM | POA: Diagnosis not present

## 2023-11-02 ENCOUNTER — Encounter: Payer: Self-pay | Admitting: Internal Medicine

## 2023-11-02 ENCOUNTER — Ambulatory Visit (INDEPENDENT_AMBULATORY_CARE_PROVIDER_SITE_OTHER): Payer: BC Managed Care – PPO | Admitting: Internal Medicine

## 2023-11-02 VITALS — BP 132/80 | HR 53 | Temp 98.1°F | Resp 16 | Ht 64.0 in | Wt 203.5 lb

## 2023-11-02 DIAGNOSIS — E119 Type 2 diabetes mellitus without complications: Secondary | ICD-10-CM

## 2023-11-02 DIAGNOSIS — Z0001 Encounter for general adult medical examination with abnormal findings: Secondary | ICD-10-CM

## 2023-11-02 DIAGNOSIS — Z23 Encounter for immunization: Secondary | ICD-10-CM

## 2023-11-02 DIAGNOSIS — E785 Hyperlipidemia, unspecified: Secondary | ICD-10-CM | POA: Diagnosis not present

## 2023-11-02 DIAGNOSIS — F419 Anxiety disorder, unspecified: Secondary | ICD-10-CM | POA: Diagnosis not present

## 2023-11-02 DIAGNOSIS — F4320 Adjustment disorder, unspecified: Secondary | ICD-10-CM | POA: Diagnosis not present

## 2023-11-02 DIAGNOSIS — Z0000 Encounter for general adult medical examination without abnormal findings: Secondary | ICD-10-CM

## 2023-11-02 NOTE — Progress Notes (Unsigned)
 Subjective:    Patient ID: Janice Shaw, female    DOB: [birth year], 63 y.o.   MRN: [id number]  DOS:  11/02/2023 Type of visit - description: CPX  Here for CPX.  Chronic medical problems addressed. Loss of husband last year, going through a difficult time, sees a Veterinary surgeon and psychiatrist NP. Denies any suicidal ideas.  Wt Readings from Last 3 Encounters:  11/02/23 203 lb 8 oz (92.3 kg)  06/30/23 201 lb 6 oz (91.3 kg)  08/28/22 186 lb 2 oz (84.4 kg)     Review of Systems  Other than above, a 14 point review of systems is negative       Past Medical History:  Diagnosis Date   Asthma    Diabetes (HCC) 02/14/2013   Dyslipidemia 02/14/2013   Elevated BP    took HCTZ   GERD (gastroesophageal reflux disease)    Osteochondroma    Left femur   Pancreatitis 02/14/2013   Due to HCTZ ? Saw Dr Tova Fresh     Past Surgical History:  Procedure Laterality Date   KNEE ARTHROSCOPY Left 07/31/2019   PARTIAL HYSTERECTOMY  2009   no oophorectomy   removal of VIN     thrombosed hemorroid     Social History   Socioeconomic History   Marital status: Widowed    Spouse name: Not on file   Number of children: 0   Years of education: Not on file   Highest education level: 12th grade  Occupational History   Occupation: para Orthoptist: [employer]  Tobacco Use   Smoking status: Every Day    Current packs/day: 0.00    Average packs/day: 0.5 packs/day for 33.0 years (16.5 ttl pk-yrs)    Types: Cigarettes    Start date: 04/23/1972    Last attempt to quit: 04/23/2005    Years since quitting: 18.5   Smokeless tobacco: Never   Tobacco comments:    Restarted smoking early 2025, < 1/2 ppd   Substance and Sexual Activity   Alcohol use: Yes    Comment: socially    Drug use: Yes    Types: Marijuana    Comment: marijuana ocaasionally   Sexual activity: Not on file  Other Topics Concern   Not on file  Social History Narrative   Lost husband ~ 03-2023 , live by self ,  no close  family around, has some friends    Social Drivers of Corporate investment banker Strain: Low Risk  (06/23/2023)   Overall Financial Resource Strain (CARDIA)    Difficulty of Paying Living Expenses: Not very hard  Food Insecurity: Food Insecurity Present (06/23/2023)   Hunger Vital Sign    Worried About Running Out of Food in the Last Year: Sometimes true    Ran Out of Food in the Last Year: Sometimes true  Transportation Needs: No Transportation Needs (06/23/2023)   PRAPARE - Administrator, Civil Service (Medical): No    Lack of Transportation (Non-Medical): No  Physical Activity: Unknown (06/23/2023)   Exercise Vital Sign    Days of Exercise per Week: 0 days    Minutes of Exercise per Session: Not on file  Stress: Stress Concern Present (06/23/2023)   [employer] of Occupational Health - Occupational Stress Questionnaire    Feeling of Stress : To some extent  Social Connections: Socially Isolated (06/23/2023)   Social Connection and Isolation Panel [NHANES]    Frequency of Communication with Friends and Family:  Three times a week    Frequency of Social Gatherings with Friends and Family: Once a week    Attends Religious Services: Never    Database administrator or Organizations: No    Attends Engineer, structural: Not on file    Marital Status: Widowed  Catering manager Violence: Not on file    Current Outpatient Medications  Medication Instructions   albuterol  (VENTOLIN  HFA) 108 (90 Base) MCG/ACT inhaler 2 puffs, Every 6 hours PRN   ALPRAZolam  (XANAX ) 0.25-0.5 mg, Oral, At bedtime PRN   ascorbic acid (VITAMIN C) 500 mg, Daily   budesonide -formoterol  (SYMBICORT ) 80-4.5 MCG/ACT inhaler 2 puffs, Inhalation, 2 times daily   buPROPion (WELLBUTRIN XL) 150 mg, Daily   Magnesium 500 MG CAPS 1 capsule, Daily   omeprazole  (PRILOSEC) 20 MG capsule Oral, Daily   pravastatin  (PRAVACHOL ) 40 mg, Oral, Daily at bedtime   Probiotic Product (PROBIOTIC DAILY  PO) 1 tablet, Daily   sertraline  (ZOLOFT ) 100 mg, Oral, Daily   Vitamin D3 2,000 Units, Oral, Daily       Objective:   Physical Exam BP 132/80   Pulse (!) 53   Temp 98.1 F (36.7 C) (Oral)   Resp 16   Ht 5' 4 (1.626 m)   Wt 203 lb 8 oz (92.3 kg)   SpO2 97%   BMI 34.93 kg/m  General: Well developed, NAD, BMI noted Neck: No  thyromegaly  HEENT:  Normocephalic . Face symmetric, atraumatic Lungs:  CTA B Normal respiratory effort, no intercostal retractions, no accessory muscle use. Heart: RRR,  no murmur.  Abdomen:  Not distended, soft, non-tender. No rebound or rigidity.   Lower extremities: no pretibial edema bilaterally  Skin: Exposed areas without rash. Not pale. Not jaundice Neurologic:  alert & oriented X3.  Speech normal, gait appropriate for age and unassisted Strength symmetric and appropriate for age.  Psych: Cognition and judgment appear intact.  Cooperative with normal attention span and concentration.  Behavior appropriate. No anxious or depressed appearing.     Assessment     Assessment DM a1c  7.1 (2014) Elevated BP   Dyslipidemia  (Lipitor d/c  2021 -- aches) Anxiety, Insomnia , grieving -depression (lost husband ~ 03/2023) GERD Cough variant Asthma, seasonal  Pancreatitis 2014, d/t HCTZ? Dr. Herma Longest, facial, [phone]  PLAN:  Here for CPX Tdap: 06/2023 [facility] vaccine: 2016, 2017.PNM 20 today COVID-vaccine 03/2023 [facility] I recommend: Shingrix,     flu shot every fall Female care: Hysterectomy age 82; rec to see gyn  MMG 05-2023 CCS: C-scope 2009, Cscope 02/2023, next per GI DEXA WNL 03-2022 Labs: AST ALT FLP CBC A1c TSH  Other issues addressed: DM: Currently diet control, healthy habits discussed, recheck A1c.  Medications?. Check EKG: Bradycardia, normal QT, no previous EKGs Elevated BP h/o: BP is normal today. Dyslipidemia: Last LDL increased to 118, Pravachol  increased. Recheck labs, LDL goal: 70. Anxiety, insomnia.  Now with also  grieving and depression due to losing her husband On chronic sertraline , sees a counselor and a psychiatry NP, they started Wellbutrin last month because depression was not well-controlled.  Uses Xanax  very seldom for insomnia. They are considering a medication such as Vyvanse and request EKG for that reason. EKG today shows sinus bradycardia, QT normal, no contraindications for Vyvanse based on EKG.   Listening therapy provided today, she started smoking tobacco again few months ago, also uses marijuana sporadically.  Will address those issues in few months. RTC 3 to 4 months

## 2023-11-02 NOTE — Patient Instructions (Signed)
   GO TO THE LAB :  Get the blood work   Your results will be posted on MyChart with my comments  Next office visit for a checkup in 3 to 4 months Please make an appointment before you leave today

## 2023-11-03 ENCOUNTER — Ambulatory Visit: Payer: Self-pay | Admitting: Internal Medicine

## 2023-11-03 ENCOUNTER — Encounter: Payer: Self-pay | Admitting: Internal Medicine

## 2023-11-03 DIAGNOSIS — F902 Attention-deficit hyperactivity disorder, combined type: Secondary | ICD-10-CM | POA: Diagnosis not present

## 2023-11-03 DIAGNOSIS — F4310 Post-traumatic stress disorder, unspecified: Secondary | ICD-10-CM | POA: Diagnosis not present

## 2023-11-03 LAB — LIPID PANEL
Cholesterol: 237 mg/dL — ABNORMAL HIGH (ref <200)
HDL: 86 mg/dL (ref >=50)
LDL Cholesterol (Calc): 134 mg/dL — ABNORMAL HIGH
Non-HDL Cholesterol (Calc): 151 mg/dL — ABNORMAL HIGH (ref <130)
Total CHOL/HDL Ratio: 2.8 (calc) (ref <5.0)
Triglycerides: 76 mg/dL (ref <150)

## 2023-11-03 LAB — HEMOGLOBIN A1C
Hgb A1c MFr Bld: 6.8 % — ABNORMAL HIGH (ref <5.7)
Mean Plasma Glucose: 148 mg/dL
eAG (mmol/L): 8.2 mmol/L

## 2023-11-03 LAB — ALT: ALT: 19 U/L (ref 6–29)

## 2023-11-03 LAB — AST: AST: 25 U/L (ref 10–35)

## 2023-11-03 LAB — TSH: TSH: 3.21 m[IU]/L (ref 0.40–4.50)

## 2023-11-03 NOTE — Assessment & Plan Note (Signed)
 Here for CPX Other issues addressed: DM: Currently diet control, healthy habits discussed, recheck A1c.  Medications?. Check EKG: Bradycardia, normal QT, no previous EKGs Elevated BP h/o: BP is normal today. Dyslipidemia: Last LDL increased to 118, Pravachol  increased. Recheck labs, LDL goal: 70. Anxiety, insomnia.  Now with also grieving and depression due to losing her husband On chronic sertraline , sees a Veterinary surgeon and a psychiatry NP, they started Wellbutrin last month because depression was not well-controlled.  Uses Xanax  very seldom for insomnia. They are considering a medication such as Vyvanse and request EKG for that reason. EKG today shows sinus bradycardia, QT normal, no contraindications for Vyvanse based on EKG.   Listening therapy provided today, she started smoking tobacco again few months ago, also uses marijuana sporadically.  Will address those issues in few months. RTC 3 to 4 months

## 2023-11-03 NOTE — Assessment & Plan Note (Signed)
 Here for CPX Tdap: 06/2023 [facility] vaccine: 2016, 2017.PNM 20 today COVID-vaccine 03/2023 VAX I recommend: Shingrix,     flu shot every fall Female care: Hysterectomy age 63; rec to see gyn  MMG 05-2023 CCS: C-scope 2009, Cscope 02/2023, next per GI DEXA WNL 03-2022 Labs: AST ALT FLP CBC A1c TSH

## 2023-11-05 MED ORDER — EZETIMIBE 10 MG PO TABS: 10.0000 mg | ORAL_TABLET | Freq: Every day | ORAL | 3 refills | Status: AC

## 2023-11-10 DIAGNOSIS — F902 Attention-deficit hyperactivity disorder, combined type: Secondary | ICD-10-CM | POA: Diagnosis not present

## 2023-11-10 DIAGNOSIS — F4310 Post-traumatic stress disorder, unspecified: Secondary | ICD-10-CM | POA: Diagnosis not present

## 2023-11-17 DIAGNOSIS — F4310 Post-traumatic stress disorder, unspecified: Secondary | ICD-10-CM | POA: Diagnosis not present

## 2023-11-17 DIAGNOSIS — F902 Attention-deficit hyperactivity disorder, combined type: Secondary | ICD-10-CM | POA: Diagnosis not present

## 2023-11-23 DIAGNOSIS — Z124 Encounter for screening for malignant neoplasm of cervix: Secondary | ICD-10-CM | POA: Diagnosis not present

## 2023-11-23 DIAGNOSIS — R102 Pelvic and perineal pain: Secondary | ICD-10-CM | POA: Diagnosis not present

## 2023-11-24 DIAGNOSIS — F4310 Post-traumatic stress disorder, unspecified: Secondary | ICD-10-CM | POA: Diagnosis not present

## 2023-11-24 DIAGNOSIS — F902 Attention-deficit hyperactivity disorder, combined type: Secondary | ICD-10-CM | POA: Diagnosis not present

## 2023-12-01 DIAGNOSIS — F902 Attention-deficit hyperactivity disorder, combined type: Secondary | ICD-10-CM | POA: Diagnosis not present

## 2023-12-01 DIAGNOSIS — F4310 Post-traumatic stress disorder, unspecified: Secondary | ICD-10-CM | POA: Diagnosis not present

## 2023-12-06 ENCOUNTER — Ambulatory Visit (INDEPENDENT_AMBULATORY_CARE_PROVIDER_SITE_OTHER): Admitting: Internal Medicine

## 2023-12-06 ENCOUNTER — Encounter: Payer: Self-pay | Admitting: Internal Medicine

## 2023-12-06 VITALS — BP 128/80 | HR 64 | Temp 98.1°F | Resp 16 | Ht 64.0 in | Wt 201.5 lb

## 2023-12-06 DIAGNOSIS — R399 Unspecified symptoms and signs involving the genitourinary system: Secondary | ICD-10-CM | POA: Diagnosis not present

## 2023-12-06 DIAGNOSIS — M25473 Effusion, unspecified ankle: Secondary | ICD-10-CM

## 2023-12-06 DIAGNOSIS — E119 Type 2 diabetes mellitus without complications: Secondary | ICD-10-CM | POA: Diagnosis not present

## 2023-12-06 LAB — POC URINALSYSI DIPSTICK (AUTOMATED)
Bilirubin, UA: NEGATIVE
Glucose, UA: NEGATIVE
Ketones, UA: NEGATIVE
Nitrite, UA: NEGATIVE
Protein, UA: NEGATIVE
Spec Grav, UA: <=1.005 — AB (ref 1.010–1.025)
Urobilinogen, UA: 0.2 U/dL
pH, UA: 5 (ref 5.0–8.0)

## 2023-12-06 LAB — MICROALBUMIN / CREATININE URINE RATIO
Creatinine,U: 75.6 mg/dL
Microalb Creat Ratio: 104.2 mg/g — ABNORMAL HIGH (ref 0.0–30.0)
Microalb, Ur: 7.9 mg/dL — ABNORMAL HIGH (ref 0.0–1.9)

## 2023-12-06 NOTE — Progress Notes (Signed)
 Subjective:    Patient ID: Janice Shaw, female    DOB: Jul 24, 1960, 63 y.o.   MRN: [id number]  DOS:  12/06/2023 Type of visit - description: Acute  Went to the beach, did a lot of walking, then developed a swollen left ankle. The area is not red, hot or painful.  The ankle feels somewhat stiff.  Also 2 days ago developed urinary frequency and dysuria.  No lower abdominal pain or flank pain. No blood in the urine. No vaginal bleeding or discharge.  Review of Systems See above   Past Medical History:  Diagnosis Date   Asthma    Diabetes (HCC) 02/14/2013   Dyslipidemia 02/14/2013   Elevated BP    took HCTZ   GERD (gastroesophageal reflux disease)    Osteochondroma    Left femur   Pancreatitis 02/14/2013   Due to HCTZ ? Saw Dr Kristie     Past Surgical History:  Procedure Laterality Date   KNEE ARTHROSCOPY Left 07/31/2019   PARTIAL HYSTERECTOMY  2009   no oophorectomy   removal of VIN     thrombosed hemorroid      Current Outpatient Medications  Medication Instructions   albuterol  (VENTOLIN  HFA) 108 (90 Base) MCG/ACT inhaler 2 puffs, Every 6 hours PRN   ALPRAZolam  (XANAX ) 0.25-0.5 mg, Oral, At bedtime PRN   ascorbic acid (VITAMIN C) 500 mg, Daily   budesonide -formoterol  (SYMBICORT ) 80-4.5 MCG/ACT inhaler 2 puffs, Inhalation, 2 times daily   buPROPion (WELLBUTRIN XL) 300 mg, Daily   ezetimibe  (ZETIA ) 10 mg, Oral, Daily   Magnesium 500 MG CAPS 1 capsule, Daily   omeprazole  (PRILOSEC) 20 MG capsule Oral, Daily   pravastatin  (PRAVACHOL ) 40 mg, Oral, Daily at bedtime   Probiotic Product (PROBIOTIC DAILY PO) 1 tablet, Daily   sertraline  (ZOLOFT ) 100 mg, Oral, Daily   Vitamin D3 2,000 Units, Oral, Daily       Objective:   Physical Exam BP 128/80   Pulse 64   Temp 98.1 F (36.7 C) (Oral)   Resp 16   Ht 5' 4 (1.626 m)   Wt 201 lb 8 oz (91.4 kg)   SpO2 97%   BMI 34.59 kg/m  General:   Well developed, NAD, BMI noted. HEENT:  Normocephalic . Face symmetric,  atraumatic Abdomen: Soft, nontender.  No CVA tenderness. Lower extremities: R ankle normal L ankle: Mild swelling around the external malleolus.  Otherwise ankle is normal with a normal range of motion and without pain. Calves: Symmetric, soft.  Nontender Skin: Not pale. Not jaundice Neurologic:  alert & oriented X3.  Speech normal, gait appropriate for age and unassisted Psych--  Cognition and judgment appear intact.  Cooperative with normal attention span and concentration.  Behavior appropriate. No anxious or depressed appearing.      Assessment     Assessment DM a1c  7.1 (2014) Elevated BP   Dyslipidemia  (Lipitor d/c  2021 -- aches) Anxiety, Insomnia , grieving -depression (lost husband ~ 03/2023) GERD Cough variant Asthma, seasonal  Pancreatitis 2014, d/t HCTZ? Dr. Kristie Shingles, facial, [phone]  PLAN:  L Ankle swelling. Localized around the external malleolus, tendinitis from recent walks ?  No tender to palpation on the malleolus. Plan: Icing, judicious use of NSAIDs or Tylenol, if not better she will call.  Sports meds or Ortho referral?. UTI: Symptoms consistent with UTI, check a UA urine culture.  Further advised with results, some antibiotics I prescribed for UTIs may cause a sun reaction.  Does not plan  to get a lot of sun exposure in the next few days. High cholesterol: On Pravachol , recently added Zetia .

## 2023-12-06 NOTE — Assessment & Plan Note (Addendum)
 L Ankle swelling. Localized around the external malleolus, tendinitis from recent walks ?  No tender to palpation on the malleolus. Plan: Icing, judicious use of NSAIDs or Tylenol, if not better she will call.  Sports meds or Ortho referral?. UTI: Symptoms consistent with UTI, check a UA urine culture.  Further advised with results, some antibiotics I prescribed for UTIs may cause a sun reaction.  Does not plan to get a lot of sun exposure in the next few days. High cholesterol: On Pravachol , recently added Zetia .

## 2023-12-07 ENCOUNTER — Encounter: Payer: Self-pay | Admitting: Internal Medicine

## 2023-12-07 LAB — URINALYSIS, ROUTINE W REFLEX MICROSCOPIC
Bilirubin Urine: NEGATIVE
Ketones, ur: NEGATIVE
Nitrite: POSITIVE — AB
Specific Gravity, Urine: 1.015 (ref 1.000–1.030)
Urine Glucose: NEGATIVE
Urobilinogen, UA: 0.2 (ref 0.0–1.0)
pH: 6 (ref 5.0–8.0)

## 2023-12-07 MED ORDER — SULFAMETHOXAZOLE-TRIMETHOPRIM 800-160 MG PO TABS: 1.0000 | ORAL_TABLET | Freq: Two times a day (BID) | ORAL | 0 refills | Status: DC

## 2023-12-07 NOTE — Addendum Note (Signed)
 Addended by: Conan Mcmanaway D on: 12/07/2023 03:36 PM   Modules accepted: Orders

## 2023-12-07 NOTE — Telephone Encounter (Signed)
 Send Bactrim  DS 1 p.o. twice daily, 6 tablets

## 2023-12-07 NOTE — Telephone Encounter (Signed)
 Rx sent.

## 2023-12-08 ENCOUNTER — Ambulatory Visit: Payer: Self-pay | Admitting: Internal Medicine

## 2023-12-08 DIAGNOSIS — F4310 Post-traumatic stress disorder, unspecified: Secondary | ICD-10-CM | POA: Diagnosis not present

## 2023-12-08 DIAGNOSIS — F902 Attention-deficit hyperactivity disorder, combined type: Secondary | ICD-10-CM | POA: Diagnosis not present

## 2023-12-08 LAB — URINE CULTURE
MICRO NUMBER:: 16694962
SPECIMEN QUALITY:: ADEQUATE

## 2023-12-15 DIAGNOSIS — F4310 Post-traumatic stress disorder, unspecified: Secondary | ICD-10-CM | POA: Diagnosis not present

## 2023-12-15 DIAGNOSIS — F902 Attention-deficit hyperactivity disorder, combined type: Secondary | ICD-10-CM | POA: Diagnosis not present

## 2023-12-28 DIAGNOSIS — F902 Attention-deficit hyperactivity disorder, combined type: Secondary | ICD-10-CM | POA: Diagnosis not present

## 2023-12-28 DIAGNOSIS — F330 Major depressive disorder, recurrent, mild: Secondary | ICD-10-CM | POA: Diagnosis not present

## 2023-12-29 DIAGNOSIS — F4310 Post-traumatic stress disorder, unspecified: Secondary | ICD-10-CM | POA: Diagnosis not present

## 2023-12-29 DIAGNOSIS — F902 Attention-deficit hyperactivity disorder, combined type: Secondary | ICD-10-CM | POA: Diagnosis not present

## 2023-12-29 DIAGNOSIS — F330 Major depressive disorder, recurrent, mild: Secondary | ICD-10-CM | POA: Diagnosis not present

## 2024-01-08 DIAGNOSIS — F4310 Post-traumatic stress disorder, unspecified: Secondary | ICD-10-CM | POA: Diagnosis not present

## 2024-01-08 DIAGNOSIS — F902 Attention-deficit hyperactivity disorder, combined type: Secondary | ICD-10-CM | POA: Diagnosis not present

## 2024-01-08 DIAGNOSIS — F330 Major depressive disorder, recurrent, mild: Secondary | ICD-10-CM | POA: Diagnosis not present

## 2024-01-12 DIAGNOSIS — F330 Major depressive disorder, recurrent, mild: Secondary | ICD-10-CM | POA: Diagnosis not present

## 2024-01-12 DIAGNOSIS — F4310 Post-traumatic stress disorder, unspecified: Secondary | ICD-10-CM | POA: Diagnosis not present

## 2024-01-12 DIAGNOSIS — F902 Attention-deficit hyperactivity disorder, combined type: Secondary | ICD-10-CM | POA: Diagnosis not present

## 2024-01-19 DIAGNOSIS — F330 Major depressive disorder, recurrent, mild: Secondary | ICD-10-CM | POA: Diagnosis not present

## 2024-01-19 DIAGNOSIS — F4310 Post-traumatic stress disorder, unspecified: Secondary | ICD-10-CM | POA: Diagnosis not present

## 2024-01-19 DIAGNOSIS — F902 Attention-deficit hyperactivity disorder, combined type: Secondary | ICD-10-CM | POA: Diagnosis not present

## 2024-01-26 DIAGNOSIS — F902 Attention-deficit hyperactivity disorder, combined type: Secondary | ICD-10-CM | POA: Diagnosis not present

## 2024-01-26 DIAGNOSIS — F330 Major depressive disorder, recurrent, mild: Secondary | ICD-10-CM | POA: Diagnosis not present

## 2024-01-26 DIAGNOSIS — F4310 Post-traumatic stress disorder, unspecified: Secondary | ICD-10-CM | POA: Diagnosis not present

## 2024-01-31 DIAGNOSIS — K080 Exfoliation of teeth due to systemic causes: Secondary | ICD-10-CM | POA: Diagnosis not present

## 2024-02-01 DIAGNOSIS — F330 Major depressive disorder, recurrent, mild: Secondary | ICD-10-CM | POA: Diagnosis not present

## 2024-02-01 DIAGNOSIS — F902 Attention-deficit hyperactivity disorder, combined type: Secondary | ICD-10-CM | POA: Diagnosis not present

## 2024-02-02 ENCOUNTER — Other Ambulatory Visit: Payer: Self-pay | Admitting: Internal Medicine

## 2024-02-02 DIAGNOSIS — F902 Attention-deficit hyperactivity disorder, combined type: Secondary | ICD-10-CM | POA: Diagnosis not present

## 2024-02-02 DIAGNOSIS — F330 Major depressive disorder, recurrent, mild: Secondary | ICD-10-CM | POA: Diagnosis not present

## 2024-02-02 DIAGNOSIS — F4310 Post-traumatic stress disorder, unspecified: Secondary | ICD-10-CM | POA: Diagnosis not present

## 2024-02-09 DIAGNOSIS — F4310 Post-traumatic stress disorder, unspecified: Secondary | ICD-10-CM | POA: Diagnosis not present

## 2024-02-09 DIAGNOSIS — F330 Major depressive disorder, recurrent, mild: Secondary | ICD-10-CM | POA: Diagnosis not present

## 2024-02-09 DIAGNOSIS — F902 Attention-deficit hyperactivity disorder, combined type: Secondary | ICD-10-CM | POA: Diagnosis not present

## 2024-02-16 DIAGNOSIS — F330 Major depressive disorder, recurrent, mild: Secondary | ICD-10-CM | POA: Diagnosis not present

## 2024-02-16 DIAGNOSIS — F4310 Post-traumatic stress disorder, unspecified: Secondary | ICD-10-CM | POA: Diagnosis not present

## 2024-02-16 DIAGNOSIS — F902 Attention-deficit hyperactivity disorder, combined type: Secondary | ICD-10-CM | POA: Diagnosis not present

## 2024-02-23 ENCOUNTER — Encounter: Payer: Self-pay | Admitting: Internal Medicine

## 2024-02-23 ENCOUNTER — Other Ambulatory Visit (HOSPITAL_BASED_OUTPATIENT_CLINIC_OR_DEPARTMENT_OTHER): Payer: Self-pay

## 2024-02-23 ENCOUNTER — Ambulatory Visit (INDEPENDENT_AMBULATORY_CARE_PROVIDER_SITE_OTHER): Admitting: Internal Medicine

## 2024-02-23 VITALS — BP 134/60 | HR 76 | Temp 97.8°F | Resp 16 | Ht 64.0 in | Wt 197.4 lb

## 2024-02-23 DIAGNOSIS — E785 Hyperlipidemia, unspecified: Secondary | ICD-10-CM

## 2024-02-23 DIAGNOSIS — Z23 Encounter for immunization: Secondary | ICD-10-CM

## 2024-02-23 DIAGNOSIS — F419 Anxiety disorder, unspecified: Secondary | ICD-10-CM | POA: Diagnosis not present

## 2024-02-23 DIAGNOSIS — E119 Type 2 diabetes mellitus without complications: Secondary | ICD-10-CM

## 2024-02-23 LAB — CBC WITH DIFFERENTIAL/PLATELET
Basophils Absolute: 0 K/uL (ref 0.0–0.1)
Basophils Relative: 0.6 % (ref 0.0–3.0)
Eosinophils Absolute: 0.1 K/uL (ref 0.0–0.7)
Eosinophils Relative: 2 % (ref 0.0–5.0)
HCT: 44.1 % (ref 36.0–46.0)
Hemoglobin: 14.7 g/dL (ref 12.0–15.0)
Lymphocytes Relative: 25.1 % (ref 12.0–46.0)
Lymphs Abs: 1.3 K/uL (ref 0.7–4.0)
MCHC: 33.3 g/dL (ref 30.0–36.0)
MCV: 92.7 fl (ref 78.0–100.0)
Monocytes Absolute: 0.4 K/uL (ref 0.1–1.0)
Monocytes Relative: 7.2 % (ref 3.0–12.0)
Neutro Abs: 3.3 K/uL (ref 1.4–7.7)
Neutrophils Relative %: 65.1 % (ref 43.0–77.0)
Platelets: 176 K/uL (ref 150.0–400.0)
RBC: 4.75 Mil/uL (ref 3.87–5.11)
RDW: 13.1 % (ref 11.5–15.5)
WBC: 5.1 K/uL (ref 4.0–10.5)

## 2024-02-23 LAB — LIPID PANEL
Cholesterol: 158 mg/dL (ref 0–200)
HDL: 69 mg/dL (ref >39.00)
LDL Cholesterol: 75 mg/dL (ref 0–99)
NonHDL: 88.78
Total CHOL/HDL Ratio: 2
Triglycerides: 70 mg/dL (ref 0.0–149.0)
VLDL: 14 mg/dL (ref 0.0–40.0)

## 2024-02-23 LAB — BASIC METABOLIC PANEL WITH GFR
BUN: 21 mg/dL (ref 6–23)
CO2: 28 meq/L (ref 19–32)
Calcium: 9.8 mg/dL (ref 8.4–10.5)
Chloride: 103 meq/L (ref 96–112)
Creatinine, Ser: 0.8 mg/dL (ref 0.40–1.20)
GFR: 77.32 mL/min (ref >60.00)
Glucose, Bld: 145 mg/dL — ABNORMAL HIGH (ref 70–99)
Potassium: 4.9 meq/L (ref 3.5–5.1)
Sodium: 139 meq/L (ref 135–145)

## 2024-02-23 LAB — HEMOGLOBIN A1C: Hgb A1c MFr Bld: 6.9 % — ABNORMAL HIGH (ref 4.6–6.5)

## 2024-02-23 MED ORDER — COMIRNATY 30 MCG/0.3ML IM SUSY
0.3000 mL | PREFILLED_SYRINGE | Freq: Once | INTRAMUSCULAR | 0 refills | Status: AC
  Filled 2024-02-23: qty 0.3, 1d supply, fill #0

## 2024-02-23 NOTE — Patient Instructions (Addendum)
 GO TO THE LAB :  Get the blood work    Then, go to the front desk for the checkout Please make an appointment for a checkup in 3 months

## 2024-02-23 NOTE — Progress Notes (Unsigned)
 Subjective:    Patient ID: Janice Shaw, female    DOB: February 19, 1961, 63 y.o.   MRN: [id number]  DOS:  02/23/2024 Type of visit - description: Follow-up  Discussed the use of AI scribe software for clinical note transcription with the patient, who gave verbal consent to proceed.  History of Present Illness Janice Shaw is a 63 year old female who presents for a routine checkup.  Glycemic control - Diabetes mellitus with last hemoglobin A1c of 6.8% - Not currently on antihyperglycemic medication - Diet includes processed foods and occasional overindulgence in sweets    - Occasional bilateral ankle edema at the end of the day  - Significant knee pain with extensive walking - Knee pain limits ability to exercise  Respiratory symptoms - Uses inhalers as needed - No recent use of inhalers    Review of Systems See above   Past Medical History:  Diagnosis Date   Asthma    Diabetes (HCC) 02/14/2013   Dyslipidemia 02/14/2013   Elevated BP    took HCTZ   GERD (gastroesophageal reflux disease)    Osteochondroma    Left femur   Pancreatitis 02/14/2013   Due to HCTZ ? Saw Dr Kristie     Past Surgical History:  Procedure Laterality Date   KNEE ARTHROSCOPY Left 07/31/2019   PARTIAL HYSTERECTOMY  2009   no oophorectomy   removal of VIN     thrombosed hemorroid      Current Outpatient Medications  Medication Instructions   albuterol  (VENTOLIN  HFA) 108 (90 Base) MCG/ACT inhaler 2 puffs, Every 6 hours PRN   ALPRAZolam  (XANAX ) 0.25-0.5 mg, Oral, At bedtime PRN   ascorbic acid (VITAMIN C) 500 mg, Daily   budesonide -formoterol  (SYMBICORT ) 80-4.5 MCG/ACT inhaler 2 puffs, Inhalation, 2 times daily   buPROPion (WELLBUTRIN XL) 300 mg, Every morning   ezetimibe  (ZETIA ) 10 mg, Oral, Daily   lisdexamfetamine (VYVANSE) 20 mg, Every morning   Magnesium 500 MG CAPS 1 capsule, Daily   omeprazole  (PRILOSEC) 20 MG capsule Oral, Daily   pravastatin  (PRAVACHOL ) 40 mg, Oral, Daily at  bedtime   Probiotic Product (PROBIOTIC DAILY PO) 1 tablet, Daily   sertraline  (ZOLOFT ) 100 mg, Oral, Daily   sulfamethoxazole -trimethoprim  (BACTRIM  DS) 800-160 MG tablet 1 tablet, Oral, 2 times daily   Vitamin D3 2,000 Units, Oral, Daily       Objective:   Physical Exam BP 134/60   Pulse 76   Temp 97.8 F (36.6 C) (Oral)   Resp 16   Ht 5' 4 (1.626 m)   Wt 197 lb 6 oz (89.5 kg)   SpO2 94%   BMI 33.88 kg/m  General:   Well developed, NAD, BMI noted. HEENT:  Normocephalic . Face symmetric, atraumatic Lungs:  CTA B Normal respiratory effort, no intercostal retractions, no accessory muscle use. Heart: RRR,  no murmur.  Lower extremities: no pretibial edema bilaterally  Skin: Not pale. Not jaundice Neurologic:  alert & oriented X3.  Speech normal, gait appropriate for age and unassisted Psych--  Cognition and judgment appear intact.  Cooperative with normal attention span and concentration.  Behavior appropriate. No anxious or depressed appearing.      Assessment   Assessment DM a1c  7.1 (2014) Elevated BP   Dyslipidemia  (Lipitor d/c  2021 -- aches) Anxiety, Insomnia , grieving -depression (lost husband ~ 03/2023) GERD Cough variant Asthma, seasonal  Pancreatitis 2014, d/t HCTZ? Dr. Kristie Shingles, facial, [phone]  PLAN:   BMP FLP A1c micro CBC  Ankle edema: Has occasional ankle edema at the end of the day.  No edema on today's exam.  Observation  Assessment and Plan Assessment & Plan Type 2 diabetes mellitus Diet control. Last A1c was 6.8. - Recheck A1c today. - Consider prescribing GLP-1 receptor agonist if A1c remains elevated. Discuss cost and side effects, including nausea and constipation, with her. Consider metformin if GLP-1 receptor agonist is not feasible. -Extensive discussion about a healthy diet on the benefits of getting a gym membership to do stationary bike treadmill if possible. Hyperlipidemia Managed with Pravachol  last LDL was 134, Zetia  was  added.  Check a lipid panel.  .  Depression, anxiety, and insomnia Well-controlled with Xanax  as needed, Wellbutrin, and sertraline . Emotional well-being improved with new job and upcoming vacation.  LE edema: Mild peri-ankle swelling, particularly at the end of the day, likely related to age and weight.  Preventive care: Flu shot today, encouraged her COVID booster RTC 3 months

## 2024-02-24 ENCOUNTER — Ambulatory Visit: Payer: Self-pay | Admitting: Internal Medicine

## 2024-02-24 LAB — MICROALBUMIN / CREATININE URINE RATIO
Creatinine,U: 95.3 mg/dL
Microalb Creat Ratio: 16.5 mg/g (ref 0.0–30.0)
Microalb, Ur: 1.6 mg/dL (ref 0.0–1.9)

## 2024-02-24 MED ORDER — OZEMPIC (0.25 OR 0.5 MG/DOSE) 2 MG/3ML ~~LOC~~ SOPN: 0.2500 mg | PEN_INJECTOR | SUBCUTANEOUS | 1 refills | Status: DC

## 2024-02-24 NOTE — Assessment & Plan Note (Signed)
 Type 2 diabetes mellitus -Diet control. Last A1c was 6.8. Recheck A1c today. - Consider prescribing GLP-1. Discussed cost and side effects, including nausea and constipation -Consider metformin if GLP-1 receptor agonist is not feasible. -Extensive discussion about a healthy diet on the benefits of getting a gym membership to do stationary bike treadmill if possible. Hyperlipidemia Managed with Pravachol  last LDL was 134, Zetia  was added.  Check a lipid panel. Ankle edema: Has occasional B ankle edema at the end of the day.  No edema on today's exam.  Observation Depression, anxiety, and insomnia Well-controlled with Xanax  as needed, Wellbutrin, and sertraline . Emotional well-being improved with new job and upcoming vacation. Preventive care: Flu shot today, encouraged her COVID booster RTC 3 months

## 2024-02-29 DIAGNOSIS — F330 Major depressive disorder, recurrent, mild: Secondary | ICD-10-CM | POA: Diagnosis not present

## 2024-02-29 DIAGNOSIS — F902 Attention-deficit hyperactivity disorder, combined type: Secondary | ICD-10-CM | POA: Diagnosis not present

## 2024-03-17 NOTE — Progress Notes (Signed)
 Janice Shaw                                          MRN: [id number]   03/17/2024   The [facility] Quality Team Specialist reviewed this patient medical record for the purposes of chart review for care gap closure. The following were reviewed: abstraction for care gap closure-diabetic eye exam and kidney health evaluation for diabetes:eGFR  and uACR.    [facility] Quality Team

## 2024-03-28 DIAGNOSIS — F902 Attention-deficit hyperactivity disorder, combined type: Secondary | ICD-10-CM | POA: Diagnosis not present

## 2024-03-28 DIAGNOSIS — F330 Major depressive disorder, recurrent, mild: Secondary | ICD-10-CM | POA: Diagnosis not present

## 2024-04-17 ENCOUNTER — Encounter: Payer: Self-pay | Admitting: Internal Medicine

## 2024-04-18 MED ORDER — OZEMPIC (0.25 OR 0.5 MG/DOSE) 2 MG/3ML ~~LOC~~ SOPN: 0.5000 mg | PEN_INJECTOR | SUBCUTANEOUS | 1 refills | Status: DC

## 2024-04-25 DIAGNOSIS — F330 Major depressive disorder, recurrent, mild: Secondary | ICD-10-CM | POA: Diagnosis not present

## 2024-04-25 DIAGNOSIS — F902 Attention-deficit hyperactivity disorder, combined type: Secondary | ICD-10-CM | POA: Diagnosis not present

## 2024-05-05 ENCOUNTER — Other Ambulatory Visit: Payer: Self-pay | Admitting: Internal Medicine

## 2024-05-23 ENCOUNTER — Encounter: Payer: Self-pay | Admitting: Internal Medicine

## 2024-06-05 ENCOUNTER — Ambulatory Visit: Admitting: Internal Medicine

## 2024-06-05 ENCOUNTER — Encounter: Payer: Self-pay | Admitting: Internal Medicine

## 2024-06-05 ENCOUNTER — Ambulatory Visit: Payer: Self-pay | Admitting: Internal Medicine

## 2024-06-05 VITALS — BP 126/84 | HR 64 | Temp 97.6°F | Resp 16 | Ht 64.0 in | Wt 182.1 lb

## 2024-06-05 DIAGNOSIS — E119 Type 2 diabetes mellitus without complications: Secondary | ICD-10-CM | POA: Diagnosis not present

## 2024-06-05 DIAGNOSIS — R935 Abnormal findings on diagnostic imaging of other abdominal regions, including retroperitoneum: Secondary | ICD-10-CM | POA: Diagnosis not present

## 2024-06-05 DIAGNOSIS — Z7985 Long-term (current) use of injectable non-insulin antidiabetic drugs: Secondary | ICD-10-CM

## 2024-06-05 DIAGNOSIS — F419 Anxiety disorder, unspecified: Secondary | ICD-10-CM

## 2024-06-05 LAB — HEMOGLOBIN A1C: Hgb A1c MFr Bld: 6.6 % — ABNORMAL HIGH (ref 4.6–6.5)

## 2024-06-05 NOTE — Patient Instructions (Signed)
 Please read your instructions carefully.   GO TO THE LAB :  Get the blood work    Go to the front desk for the checkout Please make an appointment for a checkup in 4 months

## 2024-06-05 NOTE — Assessment & Plan Note (Signed)
 DM: Last A1c was 6.9, started Ozempic , current dose 0.5 mg weekly.  She has lost weight (except in the last few weeks).  We talked about increase or staying the same dose, will check A1c today, she is inclined to stay on the same dose. Encourage healthy diet, stay active. Feet exam negative. Obesity: She would like to lose some more weight, see above. UTI: Seen at the ED 05/20/2023.  Creatinine 0.9, LFTs normal, CBC okay. CT abdomen: Mild dependent perinephric fluid and severe regional edema/fascial thickening noted. There is a dominant left renal cystic lesion involving the posterior cortex on image 2-59 measuring 3.3 x 2.2 cm, with slight wall thickening. No hydronephrosis. No obstructing calcified stones. Slight increased attenuation within the renal medullary regions, possibly related to dehydration.  Bladder: Unremarkable for distention.  Reproductive organs: No acute findings.  Since the ER visit, she finished antibiotics prescribed in the ER, she is now asymptomatic.  Reports she saw urology here in [place], they recommended a MRI in June. Anxiety, depression and insomnia: Feeling well, on Wellbutrin, sertraline , Vyvanse -- prescribed elsewhere.  Not needing Xanax  anymore. Preventive care: Had a flu and a COVID-vaccine, declined Shingrix today. RTC 4 months

## 2024-06-05 NOTE — Progress Notes (Signed)
 "  Subjective:    Patient ID: Janice Shaw, female    DOB: 04/11/1961, 64 y.o.   MRN: [id number]  DOS:  06/05/2024 Follow-up  Chronic medical problems addressed. Was in [place] , develop left flank pain, nausea vomiting. Went to the ER over there, Dx with a UTI, had an antibiotic, feels better.  Wt Readings from Last 3 Encounters:  06/05/24 182 lb 2 oz (82.6 kg)  02/23/24 197 lb 6 oz (89.5 kg)  12/06/23 201 lb 8 oz (91.4 kg)     Review of Systems See above   Past Medical History:  Diagnosis Date   Asthma    Diabetes (HCC) 02/14/2013   Dyslipidemia 02/14/2013   Elevated BP    took HCTZ   GERD (gastroesophageal reflux disease)    Osteochondroma    Left femur   Pancreatitis 02/14/2013   Due to HCTZ ? Saw Dr Kristie     Past Surgical History:  Procedure Laterality Date   KNEE ARTHROSCOPY Left 07/31/2019   PARTIAL HYSTERECTOMY  2009   no oophorectomy   removal of VIN     thrombosed hemorroid      Current Outpatient Medications  Medication Instructions   albuterol  (VENTOLIN  HFA) 108 (90 Base) MCG/ACT inhaler 2 puffs, Every 6 hours PRN   ALPRAZolam  (XANAX ) 0.25-0.5 mg, Oral, At bedtime PRN   ascorbic acid (VITAMIN C) 500 mg, Daily   budesonide -formoterol  (SYMBICORT ) 80-4.5 MCG/ACT inhaler 2 puffs, Inhalation, 2 times daily   buPROPion (WELLBUTRIN XL) 300 mg, Every morning   ezetimibe  (ZETIA ) 10 mg, Oral, Daily   lisdexamfetamine (VYVANSE) 20 mg, Every morning   Magnesium 500 MG CAPS 1 capsule, Daily   omeprazole  (PRILOSEC) 20 mg, Oral, Daily   Ozempic  (0.25 or 0.5 MG/DOSE) 0.5 mg, Subcutaneous, Weekly   pravastatin  (PRAVACHOL ) 40 mg, Oral, Daily at bedtime   Probiotic Product (PROBIOTIC DAILY PO) 1 tablet, Daily   sertraline  (ZOLOFT ) 100 mg, Oral, Daily   Vitamin D3 2,000 Units, Oral, Daily       Objective:   Physical Exam BP 126/84   Pulse 64   Temp 97.6 F (36.4 C) (Oral)   Resp 16   Ht 5' 4 (1.626 m)   Wt 182 lb 2 oz (82.6 kg)   SpO2 97%   BMI 31.26  kg/m  General:   Well developed, NAD, BMI noted. HEENT:  Normocephalic . Face symmetric, atraumatic Lungs:  CTA B Normal respiratory effort, no intercostal retractions, no accessory muscle use. Heart: RRR,  no murmur.  DM foot exam: No edema, pinprick examination normal Skin: Not pale. Not jaundice Neurologic:  alert & oriented X3.  Speech normal, gait appropriate for age and unassisted Psych--  Cognition and judgment appear intact.  Cooperative with normal attention span and concentration.  Behavior appropriate. No anxious or depressed appearing.      Assessment   Assessment DM a1c  7.1 (2014) Elevated BP   Dyslipidemia  (Lipitor d/c  2021 -- aches) Anxiety, Insomnia , grieving -depression (lost husband ~ 03/2023) GERD Cough variant Asthma, seasonal  Pancreatitis 2014, d/t HCTZ? Dr. Kristie Shingles, facial, [phone]  PLAN DM: Last A1c was 6.9, started Ozempic , current dose 0.5 mg weekly.  She has lost weight (except in the last few weeks).  We talked about increase or staying the same dose, will check A1c today, she is inclined to stay on the same dose. Encourage healthy diet, stay active. Feet exam negative. Obesity: She would like to lose some more weight, see above.  UTI: Seen at the ED 05/20/2023.  Creatinine 0.9, LFTs normal, CBC okay. CT abdomen: Mild dependent perinephric fluid and severe regional edema/fascial thickening noted. There is a dominant left renal cystic lesion involving the posterior cortex on image 2-59 measuring 3.3 x 2.2 cm, with slight wall thickening. No hydronephrosis. No obstructing calcified stones. Slight increased attenuation within the renal medullary regions, possibly related to dehydration.  Bladder: Unremarkable for distention.  Reproductive organs: No acute findings.  Since the ER visit, she finished antibiotics prescribed in the ER, she is now asymptomatic.  Reports she saw urology here in [place], they recommended a MRI in June. Anxiety,  depression and insomnia: Feeling well, on Wellbutrin, sertraline , Vyvanse -- prescribed elsewhere.  Not needing Xanax  anymore. Preventive care: Had a flu and a COVID-vaccine, declined Shingrix today. RTC 4 months    "

## 2024-06-23 ENCOUNTER — Encounter: Payer: Self-pay | Admitting: Internal Medicine

## 2024-06-23 ENCOUNTER — Other Ambulatory Visit: Payer: Self-pay | Admitting: Internal Medicine

## 2024-09-26 ENCOUNTER — Ambulatory Visit

## 2024-10-03 ENCOUNTER — Ambulatory Visit: Admitting: Internal Medicine
